# Patient Record
Sex: Female | Born: 1975
Health system: Southern US, Community
[De-identification: ages and names within clinical notes are randomized; demographics above are authoritative.]

## PROBLEM LIST (undated history)

## (undated) DIAGNOSIS — F32A Depression, unspecified: Secondary | ICD-10-CM

## (undated) DIAGNOSIS — Z5189 Encounter for other specified aftercare: Secondary | ICD-10-CM

## (undated) DIAGNOSIS — D649 Anemia, unspecified: Secondary | ICD-10-CM

## (undated) DIAGNOSIS — C801 Malignant (primary) neoplasm, unspecified: Secondary | ICD-10-CM

## (undated) DIAGNOSIS — Z8719 Personal history of other diseases of the digestive system: Secondary | ICD-10-CM

## (undated) DIAGNOSIS — R112 Nausea with vomiting, unspecified: Secondary | ICD-10-CM

## (undated) DIAGNOSIS — Z9889 Other specified postprocedural states: Secondary | ICD-10-CM

## (undated) DIAGNOSIS — F419 Anxiety disorder, unspecified: Secondary | ICD-10-CM

## (undated) DIAGNOSIS — K219 Gastro-esophageal reflux disease without esophagitis: Secondary | ICD-10-CM

## (undated) HISTORY — DX: Encounter for other specified aftercare: Z51.89

## (undated) HISTORY — PX: CHOLECYSTECTOMY: SHX55

## (undated) HISTORY — DX: Anxiety disorder, unspecified: F41.9

## (undated) HISTORY — DX: Gastro-esophageal reflux disease without esophagitis: K21.9

## (undated) HISTORY — DX: Malignant (primary) neoplasm, unspecified: C80.1

## (undated) HISTORY — DX: Depression, unspecified: F32.A

---

## 1997-06-15 ENCOUNTER — Other Ambulatory Visit: Admission: RE | Admit: 1997-06-15 | Discharge: 1997-06-15 | Payer: Self-pay | Admitting: Obstetrics

## 1997-10-15 ENCOUNTER — Inpatient Hospital Stay (HOSPITAL_COMMUNITY): Admission: AD | Admit: 1997-10-15 | Discharge: 1997-10-15 | Payer: Self-pay | Admitting: Obstetrics

## 1998-04-08 ENCOUNTER — Inpatient Hospital Stay (HOSPITAL_COMMUNITY): Admission: AD | Admit: 1998-04-08 | Discharge: 1998-04-08 | Payer: Self-pay | Admitting: Obstetrics

## 1998-04-23 ENCOUNTER — Encounter: Admission: RE | Admit: 1998-04-23 | Discharge: 1998-04-23 | Payer: Self-pay | Admitting: Sports Medicine

## 1998-05-04 ENCOUNTER — Encounter (INDEPENDENT_AMBULATORY_CARE_PROVIDER_SITE_OTHER): Payer: Self-pay | Admitting: *Deleted

## 1998-05-04 LAB — CONVERTED CEMR LAB

## 1998-05-16 ENCOUNTER — Encounter: Admission: RE | Admit: 1998-05-16 | Discharge: 1998-05-16 | Payer: Self-pay | Admitting: Family Medicine

## 1998-05-16 ENCOUNTER — Other Ambulatory Visit: Admission: RE | Admit: 1998-05-16 | Discharge: 1998-05-16 | Payer: Self-pay | Admitting: *Deleted

## 1998-05-21 ENCOUNTER — Inpatient Hospital Stay (HOSPITAL_COMMUNITY): Admission: AD | Admit: 1998-05-21 | Discharge: 1998-05-21 | Payer: Self-pay | Admitting: *Deleted

## 1998-05-22 ENCOUNTER — Ambulatory Visit (HOSPITAL_COMMUNITY): Admission: RE | Admit: 1998-05-22 | Discharge: 1998-05-22 | Payer: Self-pay | Admitting: *Deleted

## 1998-05-22 ENCOUNTER — Encounter: Payer: Self-pay | Admitting: *Deleted

## 1998-06-18 ENCOUNTER — Ambulatory Visit (HOSPITAL_COMMUNITY): Admission: RE | Admit: 1998-06-18 | Discharge: 1998-06-18 | Payer: Self-pay

## 1998-06-19 ENCOUNTER — Encounter: Admission: RE | Admit: 1998-06-19 | Discharge: 1998-06-19 | Payer: Self-pay | Admitting: Family Medicine

## 1998-07-02 ENCOUNTER — Other Ambulatory Visit: Admission: RE | Admit: 1998-07-02 | Discharge: 1998-07-02 | Payer: Self-pay | Admitting: Family Medicine

## 1998-07-02 ENCOUNTER — Encounter: Admission: RE | Admit: 1998-07-02 | Discharge: 1998-07-02 | Payer: Self-pay | Admitting: Family Medicine

## 1998-07-12 ENCOUNTER — Inpatient Hospital Stay (HOSPITAL_COMMUNITY): Admission: AD | Admit: 1998-07-12 | Discharge: 1998-07-12 | Payer: Self-pay | Admitting: Obstetrics & Gynecology

## 1998-07-29 ENCOUNTER — Encounter: Admission: RE | Admit: 1998-07-29 | Discharge: 1998-07-29 | Payer: Self-pay | Admitting: Family Medicine

## 1998-07-31 ENCOUNTER — Inpatient Hospital Stay (HOSPITAL_COMMUNITY): Admission: AD | Admit: 1998-07-31 | Discharge: 1998-07-31 | Payer: Self-pay | Admitting: Obstetrics

## 1998-08-02 ENCOUNTER — Encounter: Admission: RE | Admit: 1998-08-02 | Discharge: 1998-08-02 | Payer: Self-pay | Admitting: Family Medicine

## 1998-08-09 ENCOUNTER — Encounter: Admission: RE | Admit: 1998-08-09 | Discharge: 1998-08-09 | Payer: Self-pay | Admitting: Family Medicine

## 1998-08-26 ENCOUNTER — Encounter: Admission: RE | Admit: 1998-08-26 | Discharge: 1998-08-26 | Payer: Self-pay | Admitting: Family Medicine

## 1998-09-07 ENCOUNTER — Inpatient Hospital Stay (HOSPITAL_COMMUNITY): Admission: AD | Admit: 1998-09-07 | Discharge: 1998-09-07 | Payer: Self-pay | Admitting: *Deleted

## 1998-09-10 ENCOUNTER — Encounter: Admission: RE | Admit: 1998-09-10 | Discharge: 1998-09-10 | Payer: Self-pay | Admitting: Family Medicine

## 1998-09-24 ENCOUNTER — Encounter: Admission: RE | Admit: 1998-09-24 | Discharge: 1998-09-24 | Payer: Self-pay | Admitting: Sports Medicine

## 1998-09-27 ENCOUNTER — Inpatient Hospital Stay (HOSPITAL_COMMUNITY): Admission: AD | Admit: 1998-09-27 | Discharge: 1998-10-02 | Payer: Self-pay | Admitting: Obstetrics & Gynecology

## 1998-10-02 ENCOUNTER — Encounter: Admission: RE | Admit: 1998-10-02 | Discharge: 1998-10-02 | Payer: Self-pay | Admitting: Family Medicine

## 1998-11-18 ENCOUNTER — Encounter: Admission: RE | Admit: 1998-11-18 | Discharge: 1998-11-18 | Payer: Self-pay | Admitting: Family Medicine

## 1998-12-03 ENCOUNTER — Encounter: Admission: RE | Admit: 1998-12-03 | Discharge: 1998-12-03 | Payer: Self-pay | Admitting: Family Medicine

## 1999-01-02 ENCOUNTER — Encounter: Payer: Self-pay | Admitting: Emergency Medicine

## 1999-01-02 ENCOUNTER — Encounter (INDEPENDENT_AMBULATORY_CARE_PROVIDER_SITE_OTHER): Payer: Self-pay | Admitting: Specialist

## 1999-01-02 ENCOUNTER — Inpatient Hospital Stay (HOSPITAL_COMMUNITY): Admission: EM | Admit: 1999-01-02 | Discharge: 1999-01-03 | Payer: Self-pay | Admitting: Emergency Medicine

## 1999-01-02 ENCOUNTER — Encounter: Payer: Self-pay | Admitting: General Surgery

## 1999-01-30 ENCOUNTER — Encounter: Admission: RE | Admit: 1999-01-30 | Discharge: 1999-01-30 | Payer: Self-pay | Admitting: Family Medicine

## 1999-02-21 ENCOUNTER — Encounter: Admission: RE | Admit: 1999-02-21 | Discharge: 1999-02-21 | Payer: Self-pay | Admitting: Family Medicine

## 1999-03-17 ENCOUNTER — Other Ambulatory Visit: Admission: RE | Admit: 1999-03-17 | Discharge: 1999-03-17 | Payer: Self-pay | Admitting: *Deleted

## 1999-03-17 ENCOUNTER — Encounter: Admission: RE | Admit: 1999-03-17 | Discharge: 1999-03-17 | Payer: Self-pay | Admitting: Family Medicine

## 1999-04-29 ENCOUNTER — Encounter: Admission: RE | Admit: 1999-04-29 | Discharge: 1999-04-29 | Payer: Self-pay | Admitting: Obstetrics & Gynecology

## 1999-07-31 ENCOUNTER — Encounter: Admission: RE | Admit: 1999-07-31 | Discharge: 1999-07-31 | Payer: Self-pay | Admitting: Family Medicine

## 1999-08-22 ENCOUNTER — Encounter: Admission: RE | Admit: 1999-08-22 | Discharge: 1999-08-22 | Payer: Self-pay | Admitting: Obstetrics & Gynecology

## 1999-08-25 ENCOUNTER — Encounter: Admission: RE | Admit: 1999-08-25 | Discharge: 1999-08-25 | Payer: Self-pay | Admitting: Family Medicine

## 1999-08-27 ENCOUNTER — Encounter: Admission: RE | Admit: 1999-08-27 | Discharge: 1999-08-27 | Payer: Self-pay | Admitting: Family Medicine

## 2003-10-03 ENCOUNTER — Emergency Department (HOSPITAL_COMMUNITY): Admission: EM | Admit: 2003-10-03 | Discharge: 2003-10-03 | Payer: Self-pay | Admitting: Emergency Medicine

## 2005-09-02 ENCOUNTER — Other Ambulatory Visit: Admission: RE | Admit: 2005-09-02 | Discharge: 2005-09-02 | Payer: Self-pay | Admitting: Gynecology

## 2006-07-02 ENCOUNTER — Encounter (INDEPENDENT_AMBULATORY_CARE_PROVIDER_SITE_OTHER): Payer: Self-pay | Admitting: *Deleted

## 2006-12-14 ENCOUNTER — Ambulatory Visit (HOSPITAL_COMMUNITY): Admission: RE | Admit: 2006-12-14 | Discharge: 2006-12-14 | Payer: Self-pay | Admitting: Obstetrics and Gynecology

## 2006-12-23 ENCOUNTER — Inpatient Hospital Stay (HOSPITAL_COMMUNITY): Admission: RE | Admit: 2006-12-23 | Discharge: 2006-12-27 | Payer: Self-pay | Admitting: Obstetrics and Gynecology

## 2008-06-10 IMAGING — US US AMNIOCENTESIS
1 series · 1 of 1 positions shown · non-contrast
Comparison: none

ULTRASOUND-GUIDED AMNIOCENTESIS:
 Ultrasound was utilized to perform amniocentesis by the requesting physician.

[Series 1: us amniocentesis · 1 of 1 slices shown]
[im 1/1]
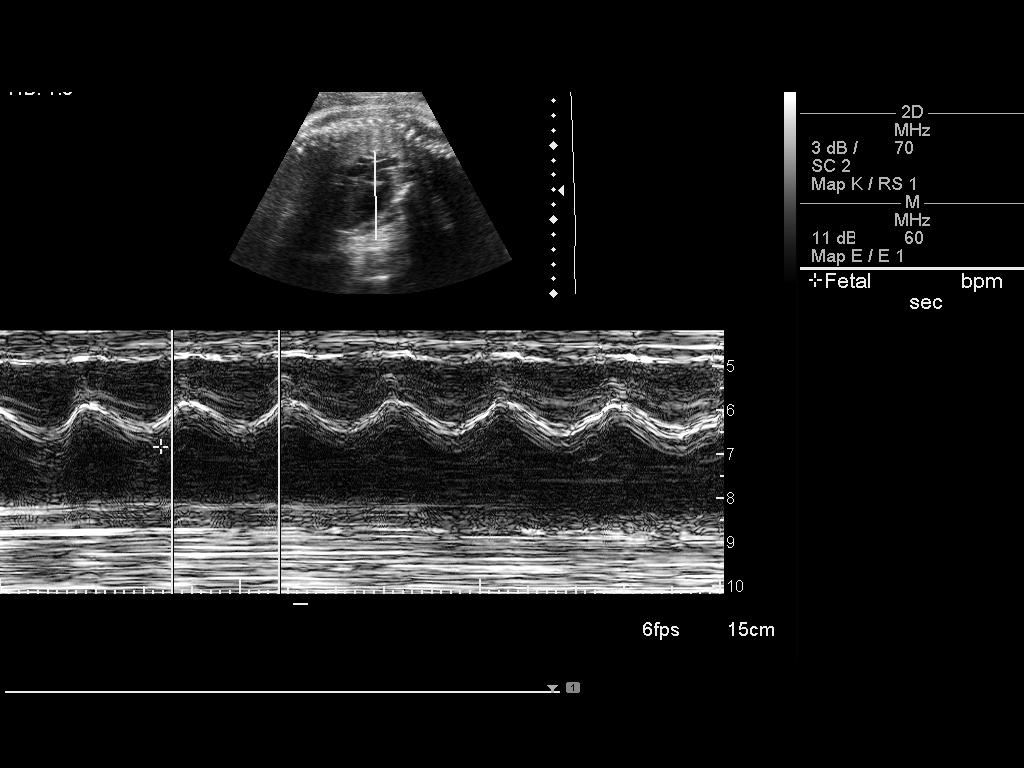

[1 of 1 positions shown; findings below may reference images not displayed]

IMPRESSION: No radiographic interpretation.

## 2010-05-25 ENCOUNTER — Encounter: Payer: Self-pay | Admitting: Obstetrics and Gynecology

## 2010-09-16 NOTE — Op Note (Signed)
NAME:  Dawn Russo, Dawn Russo                ACCOUNT NO.:  [id number]   MEDICAL RECORD NO.:  [id number]          PATIENT TYPE:  INP   LOCATION:  9141                          FACILITY:  [facility]   PHYSICIAN:  Kendra H. Tenny Craw, MD     DATE OF BIRTH:  03/10/1976   DATE OF PROCEDURE:  12/23/2006  DATE OF DISCHARGE:                               OPERATIVE REPORT   ATTENDING PHYSICIAN:  Kendra H. Tenny Craw, M.D.   PREOPERATIVE DIAGNOSIS:  1. 37 and 4-week intrauterine pregnancy.  2. A1 gestational diabetes mellitus.  3. History of prior cesarean section x2.  4. 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   POSTOPERATIVE DIAGNOSIS:  1. 37 and 4-week intrauterine pregnancy.  2. A1 gestational diabetes mellitus.  3. History of prior cesarean section x2.  4. 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  5. Small lower uterine segment window.  6. Left adnexal endometriotic implant.   PROCEDURE:  Repeat low transverse cesarean section and fulguration of  left ovarian endometriosis.   SURGEON:  Freddrick March. Tenny Craw, M.D.   ASSISTANT:  Luvenia Redden, M.D.   ANESTHESIA:  Spinal.   FINDINGS:  Vigorous female infant in the vertex presentation weighing 7  pounds 3 ounces with Apgar scores of 7 at 1 minute and 7 at 5 minutes.  Small endometriotic implant was noted to the left ovary. Additionally it  was noted that in the lower uterine segment and very small 1 cm window  was visualized consistent with small incisional dehiscence. Additionally  the lower uterine segment was extremely thin and weak and when it was  being repaired the suture would tear through the lower uterine segment.   SPECIMENS:  Placenta for disposal.   ESTIMATED BLOOD LOSS:  700 mL.   COMPLICATIONS:  None.   PROCEDURE:  The patient is a 35 year old gravida 4, para 2-0-1-2 who has  been followed in this pregnancy for gestational diabetes and a history  of a uterine incisional dehiscence at her last delivery.   Given her  gestational diabetes and the history of uterine dehiscence there was  concern to try to deliver prior to the onset of labor.  Therefore an  amniocentesis for fetal lung maturity was performed on August 12,  however this returned that the fetal lungs were not mature and therefore  the decision was made to wait for approximately a week and half and then  have an outright delivery. The discussion was had with Ms. Andrey Campanile that  because of the gestational diabetes and the immature fetal lungs on  amniocentesis 10 days prior that we were balancing of the risks of fetal  lung immaturity versus uterine rupture. It was felt that it was most  prudent to proceed with a repeat low transverse cesarean section prior  to the onset of labor.  Following the appropriate informed consent the  patient was brought to the operating room where spinal anesthesia was  administered and found to be adequate.  The patient was prepped and  draped in the normal sterile fashion.  A scalpel was used to make a  Pfannenstiel's skin incision using the patient's prior scar which was  carried down through the underlying layers of soft tissue to the fascia.  The fascia was incised in the midline and fascial incision was extended  laterally with Mayo scissors.  Superior aspect of the fascial incision  was grasped with Kocher clamps x2, tented up and the underlying rectus  muscles were dissected off sharply with the electrocautery unit.  The  same procedure was repeated on the inferior aspect of the fascial  incision. The rectus muscles were separated in the midline and the  abdominal peritoneum was identified and entered sharply and the incision  was extended superiorly and inferiorly with good visualization of the  bladder.  The bladder blade was then inserted. The vesicouterine  peritoneum was identified, tented up and entered sharply with the  Metzenbaum scissors and the incision was extended laterally with the   Metzenbaum and the bladder flap was created digitally.  It was upon  inspection of the lower uterine section after the creation of the  bladder flap that a very small 1 x 1 cm window in the lower uterine  segment was noted which was consistent with a small uterine dehiscence.  The remainder of the lower uterine segment was paper thin. Scalpel was  then used to make a low transverse incision on the uterus which was  extended laterally with blunt dissection.  The fetal vertex was  identified, brought up through the uterine incision.  The infant was  bulb suctioned on the operative field and then the body was delivered.  The cord was clamped and cut and the infant was passed to the waiting  pediatricians.  The infant was noted to cry vigorously on the operative  field. The placenta was spontaneously delivered.  The uterus was  exteriorized, cleared of all clot and debris.  The uterine incision was  repaired with #1 chromic in a running locked fashion.  During repair of  the uterine incision each time that the suture was placed in the  inferior aspect of the lower uterine segment because the tissue was so  thin and tenuous the suture with rip through leaving holes in the lower  uterine segment.  This was repaired with a second imbricating layer and  several further figure-of-eight sutures were used to reinforce the lower  uterine segment.  The incision was noted to be hemostatic after  completion of repair of the incision.  The ovaries were inspected and a  small implant of endometriosis was noted on the left ovary.  This was  fulgurated using the electrocautery unit.  No evidence of endometriosis  was noted on the right ovary and the tubes were both normal bilaterally.  The patient tolerated the procedure well and was brought to the recovery  room following the procedure. After the delivery of the baby and the  care of the pediatrician it was noted that the baby was grunting  somewhat and it  was felt that the baby needed to be transferred to the  neonatal ICU for further monitoring and potentially supplemental oxygen.  Final Apgar score seven and seven.      Freddrick March. Tenny Craw, MD  Electronically Signed     KHR/MEDQ  D:  12/23/2006  T:  12/23/2006  Job:  [id number]

## 2010-09-19 NOTE — Discharge Summary (Signed)
NAME:  PAULYNE, MOOTY                ACCOUNT NO.:  [id number]   MEDICAL RECORD NO.:  [id number]          PATIENT TYPE:  INP   LOCATION:  9141                          FACILITY:  [facility]   PHYSICIAN:  Randye Lobo, M.D.   DATE OF BIRTH:  1976-03-25   DATE OF ADMISSION:  12/23/2006  DATE OF DISCHARGE:  12/27/2006                               DISCHARGE SUMMARY   FINAL DIAGNOSES:  1. Intrauterine gestation at 46 and four-sevenths weeks gestation.  2. A1 gestational diabetes mellitus.  3. History of prior cesarean sections x2.  4. History of uterine incisional dehiscence with the last pregnancy.  5. Left adnexal endometriotic implant.  6. Nonimmune to rubella.  7. History of prior transfusion secondary to blood loss from prior      cesarean section.   PROCEDURE:  Repeat low transverse cesarean section with fulguration of  left ovarian endometriosis.  Surgeon:  Dr. Waynard Reeds, assistant:  Dr.  Lodema Hong.  Complications:  None.   This 35 year old G4, P2-0-1-2 presents at 80 and four-sevenths weeks  gestation for delivery.  The patient's antepartum course had been  complicated by gestational diabetes mellitus which has been well  controlled.  The patient also had a history of uterine incisional  dehiscence after her last pregnancy.  At this point, amniocentesis was  performed for lung maturity.  This was performed on August 12 so  therefore, another week was waited.  The patient did have a history of a  blood transfusion secondary to blood loss with her last cesarean  section.  This delivery wanted to be performed before the patient  actually went into labor.  The patient was taken to the operating room  on December 23, 2006, by Dr. Waynard Reeds where a repeat low transverse  cesarean section was performed with the delivery of a 7-pound 3-ounce  female infant with Apgars of 7 and 7.  There were some small endometriotic  implants noted on her left ovary which were fulgurated, and there was  also a very small 1-cm window noted in the lower uterine segment that  was consistent with her prior diagnosis.  The lower uterine segment was  also extremely thin and weak.  The patient's delivery went without  complications.  The patient's postoperative course was benign without  any significant fevers.  They did want their little boy circumcised  before discharge.  The patient was felt ready for discharge on  postoperative day #3.  She was diagnosed with possible impetigo of the  face.  She was sent home with some Keflex 250 mg q.i.d. for 7 days.  The  patient was also told to continue her prenatal vitamins and an iron  supplement two to three times daily, was given Percocet one to two every  4-6 hours as needed for pain, ibuprofen up to 600 mg every 6 hours as  needed for pain.   LABORATORIES ON DISCHARGE:  The patient had a hemoglobin of 9.0; white  blood cell count of 9.8; and platelets of 176,000.      Leilani Able, P.A.-C.  Randye Lobo, M.D.  Electronically Signed    MB/MEDQ  D:  01/26/2007  T:  01/27/2007  Job:  [id number]

## 2011-02-13 LAB — CBC
HCT: 26.1 — ABNORMAL LOW
HCT: 31.3 — ABNORMAL LOW
Hemoglobin: 10.9 — ABNORMAL LOW
Hemoglobin: 9 — ABNORMAL LOW
MCHC: 34.4
MCHC: 35
MCV: 86.4
RBC: 3.02 — ABNORMAL LOW
RDW: 12.5

## 2011-02-13 LAB — CROSSMATCH

## 2011-02-16 LAB — CBC
Hemoglobin: 11.6 — ABNORMAL LOW
MCHC: 33.3
RDW: 12.4

## 2011-02-16 LAB — BASIC METABOLIC PANEL
CO2: 22
Calcium: 9.3
Creatinine, Ser: 0.68
Glucose, Bld: 71
Sodium: 134 — ABNORMAL LOW

## 2011-02-16 LAB — FLM(FETAL LUNG MATURITY), AMNIOTIC FLUID: Fetal/Lung Maturity: 12.5

## 2014-01-25 ENCOUNTER — Encounter (HOSPITAL_COMMUNITY): Payer: Self-pay | Admitting: Emergency Medicine

## 2014-01-25 ENCOUNTER — Emergency Department (HOSPITAL_COMMUNITY)
Admission: EM | Admit: 2014-01-25 | Discharge: 2014-01-25 | Disposition: A | Discharge status: Home / Self Care | Payer: Managed Care, Other (non HMO) | Source: Home / Self Care | Attending: Family Medicine | Admitting: Family Medicine

## 2014-01-25 DIAGNOSIS — J029 Acute pharyngitis, unspecified: Secondary | ICD-10-CM

## 2014-01-25 LAB — POCT RAPID STREP A: STREPTOCOCCUS, GROUP A SCREEN (DIRECT): NEGATIVE

## 2014-01-25 MED ORDER — PREDNISONE 10 MG PO TABS
30.0000 mg | ORAL_TABLET | Freq: Every day | ORAL | Status: DC
Start: 2014-01-25 — End: 2017-11-29

## 2014-01-25 NOTE — ED Notes (Signed)
Sore throat that started last night 

## 2014-01-25 NOTE — ED Provider Notes (Signed)
Dawn Russo is a 38 y.o. female who presents to Urgent Care today for sore throat. Patient has a two-day history of sore throat with no cough or congestion. Patient does note mild fevers and chills and nausea but denies any vomiting or diarrhea. No chest pains or palpitations. Patient has tried some Chloraseptic and cough drops which helped a bit.   History reviewed. No pertinent past medical history. History  Substance Use Topics  . Smoking status: Never Smoker   . Smokeless tobacco: Not on file  . Alcohol Use: No   ROS as above Medications: No current facility-administered medications for this encounter.   Current Outpatient Prescriptions  Medication Sig Dispense Refill  . predniSONE (DELTASONE) 10 MG tablet Take 3 tablets (30 mg total) by mouth daily.  15 tablet  0    Exam:  BP 137/89  Pulse 78  Temp(Src) 97.4 F (36.3 C) (Oral)  Resp 16  SpO2 100% Gen: Well NAD HEENT: EOMI,  MMM posterior pharynx mildly erythematous without exudate. Normal tympanic membranes bilaterally. Mild bilateral anterior cervical lymphadenopathy is present. Lungs: Normal work of breathing. CTABL Heart: RRR no MRG Abd: NABS, Soft. Nondistended, Nontender Exts: Brisk capillary refill, warm and well perfused.   Results for orders placed during the hospital encounter of 01/25/14 (from the past 24 hour(s))  POCT RAPID STREP A ([facility] ONLY)     Status: None   Collection Time    01/25/14  1:07 PM      Result Value Ref Range   Streptococcus, Group A Screen (Direct) NEGATIVE  NEGATIVE   No results found.  Assessment and Plan: 38 y.o. female with viral pharyngitis. Culture pending. Treat with prednisone  Discussed warning signs or symptoms. Please see discharge instructions. Patient expresses understanding.     Rodolph Bong, MD 01/25/14 1324

## 2014-01-25 NOTE — Discharge Instructions (Signed)
Thank you for coming in today. Take prednisone daily for 5 days. Use Tylenol as needed. Call or go to the emergency room if you get worse, have trouble breathing, have chest pains, or palpitations.   Pharyngitis Pharyngitis is redness, pain, and swelling (inflammation) of your pharynx.  CAUSES  Pharyngitis is usually caused by infection. Most of the time, these infections are from viruses (viral) and are part of a cold. However, sometimes pharyngitis is caused by bacteria (bacterial). Pharyngitis can also be caused by allergies. Viral pharyngitis may be spread from person to person by coughing, sneezing, and personal items or utensils (cups, forks, spoons, toothbrushes). Bacterial pharyngitis may be spread from person to person by more intimate contact, such as kissing.  SIGNS AND SYMPTOMS  Symptoms of pharyngitis include:   Sore throat.   Tiredness (fatigue).   Low-grade fever.   Headache.  Joint pain and muscle aches.  Skin rashes.  Swollen lymph nodes.  Plaque-like film on throat or tonsils (often seen with bacterial pharyngitis). DIAGNOSIS  Your health care provider will ask you questions about your illness and your symptoms. Your medical history, along with a physical exam, is often all that is needed to diagnose pharyngitis. Sometimes, a rapid strep test is done. Other lab tests may also be done, depending on the suspected cause.  TREATMENT  Viral pharyngitis will usually get better in 3-4 days without the use of medicine. Bacterial pharyngitis is treated with medicines that kill germs (antibiotics).  HOME CARE INSTRUCTIONS   Drink enough water and fluids to keep your urine clear or pale yellow.   Only take over-the-counter or prescription medicines as directed by your health care provider:   If you are prescribed antibiotics, make sure you finish them even if you start to feel better.   Do not take aspirin.   Get lots of rest.   Gargle with 8 oz of salt  water ( tsp of salt per 1 qt of water) as often as every 1-2 hours to soothe your throat.   Throat lozenges (if you are not at risk for choking) or sprays may be used to soothe your throat. SEEK MEDICAL CARE IF:   You have large, tender lumps in your neck.  You have a rash.  You cough up green, yellow-brown, or bloody spit. SEEK IMMEDIATE MEDICAL CARE IF:   Your neck becomes stiff.  You drool or are unable to swallow liquids.  You vomit or are unable to keep medicines or liquids down.  You have severe pain that does not go away with the use of recommended medicines.  You have trouble breathing (not caused by a stuffy nose). MAKE SURE YOU:   Understand these instructions.  Will watch your condition.  Will get help right away if you are not doing well or get worse. Document Released: 04/20/2005 Document Revised: 02/08/2013 Document Reviewed: 12/26/2012 [facility] Patient Information 2015 [place], [place]. This information is not intended to replace advice given to you by your health care provider. Make sure you discuss any questions you have with your health care provider.

## 2014-01-27 LAB — CULTURE, GROUP A STREP

## 2015-03-11 ENCOUNTER — Emergency Department (HOSPITAL_COMMUNITY)
Admission: EM | Admit: 2015-03-11 | Discharge: 2015-03-11 | Disposition: A | Discharge status: Home / Self Care | Payer: Managed Care, Other (non HMO) | Source: Home / Self Care

## 2015-03-11 ENCOUNTER — Encounter (HOSPITAL_COMMUNITY): Payer: Self-pay | Admitting: Emergency Medicine

## 2015-03-11 DIAGNOSIS — J0100 Acute maxillary sinusitis, unspecified: Secondary | ICD-10-CM

## 2015-03-11 DIAGNOSIS — J069 Acute upper respiratory infection, unspecified: Secondary | ICD-10-CM

## 2015-03-11 LAB — POCT RAPID STREP A: Streptococcus, Group A Screen (Direct): NEGATIVE

## 2015-03-11 MED ORDER — AMOXICILLIN 500 MG PO CAPS: 500.0000 mg | ORAL_CAPSULE | Freq: Three times a day (TID) | ORAL | Status: DC

## 2015-03-11 NOTE — ED Provider Notes (Signed)
CSN: [id number]     Arrival date & time 03/11/15  1315 History   None    No chief complaint on file.  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 Past Surgical History  Procedure Laterality Date  . Cholecystectomy    . Cesarean section     No family history on file. Social History  Substance Use Topics  . Smoking status: Never Smoker   . Smokeless tobacco: Not on file  . Alcohol Use: No   OB History    No data available     Review of Systems ROS +'ve flu sxs, cough, sinus pressure  Denies: HEADACHE, NAUSEA, ABDOMINAL PAIN, CHEST PAIN, CONGESTION, DYSURIA, SHORTNESS OF BREATH  Allergies  Review of patient's allergies indicates no known allergies.  Home Medications   Prior to Admission medications   Medication Sig Start Date End Date Taking? Authorizing Provider  predniSONE (DELTASONE) 10 MG tablet Take 3 tablets (30 mg total) by mouth daily. 01/25/14   Rodolph BongEvan S Corey, MD   Meds Ordered and Administered this Visit  Medications - No data to display  BP 139/89 mmHg  Pulse 75  Temp(Src) 98.6 F (37 C) (Oral)  Resp 18  SpO2 100% No data found.   Physical Exam NURSES NOTES AND VITAL SIGNS REVIEWED. CONSTITUTIONAL: Well developed, well nourished, no acute distress HEENT: normocephalic, atraumatic, TM's clear, sinuses: normal transillumination. Turbinates injected but not boggy in appearance.  EYES: Conjunctiva normal NECK:normal ROM, supple PULMONARY:No respiratory distress, normal effort, Lungs: CTAb/l CARDIOVASCULAR: RRR, no murmur ABDOMEN: soft, ND, NT, +'ve  BS MUSCULOSKELETAL: Normal ROM of all extremities SKIN: warm and dry without rash PSYCHIATRIC: Mood and affect normal  ED Course  Procedures (including critical care time)  Labs Review Labs Reviewed - No data to display  Imaging Review No results found.   Visual Acuity Review  Right Eye Distance:   Left Eye Distance:   Bilateral Distance:    Right Eye Near:   Left Eye Near:    Bilateral Near:         MDM   1. URI (upper respiratory infection)   2. Acute maxillary sinusitis, recurrence not specified    rx for acute sinusitis provided.  Return to work note given Discharged home in stable condition    Tharon AquasFrank C Patrick, GeorgiaPA 03/11/15 2043

## 2015-03-11 NOTE — Discharge Instructions (Signed)
Upper Respiratory Infection, Adult Most upper respiratory infections (URIs) are caused by a virus. A URI affects the nose, throat, and upper air passages. The most common type of URI is often called "the common cold." HOME CARE   Take medicines only as told by your doctor.  Gargle warm saltwater or take cough drops to comfort your throat as told by your doctor.  Use a warm mist humidifier or inhale steam from a shower to increase air moisture. This may make it easier to breathe.  Drink enough fluid to keep your pee (urine) clear or pale yellow.  Eat soups and other clear broths.  Have a healthy diet.  Rest as needed.  Go back to work when your fever is gone or your doctor says it is okay.  You may need to stay home longer to avoid giving your URI to others.  You can also wear a face mask and wash your hands often to prevent spread of the virus.  Use your inhaler more if you have asthma.  Do not use any tobacco products, including cigarettes, chewing tobacco, or electronic cigarettes. If you need help quitting, ask your doctor. GET HELP IF:  You are getting worse, not better.  Your symptoms are not helped by medicine.  You have chills.  You are getting more short of breath.  You have brown or red mucus.  You have yellow or brown discharge from your nose.  You have pain in your face, especially when you bend forward.  You have a fever.  You have puffy (swollen) neck glands.  You have pain while swallowing.  You have white areas in the back of your throat. GET HELP RIGHT AWAY IF:   You have very bad or constant:  Headache.  Ear pain.  Pain in your forehead, behind your eyes, and over your cheekbones (sinus pain).  Chest pain.  You have long-lasting (chronic) lung disease and any of the following:  Wheezing.  Long-lasting cough.  Coughing up blood.  A change in your usual mucus.  You have a stiff neck.  You have changes in  your:  Vision.  Hearing.  Thinking.  Mood. MAKE SURE YOU:   Understand these instructions.  Will watch your condition.  Will get help right away if you are not doing well or get worse.   This information is not intended to replace advice given to you by your health care provider. Make sure you discuss any questions you have with your health care provider.   Document Released: 10/07/2007 Document Revised: 09/04/2014 Document Reviewed: 07/26/2013 Elsevier Interactive Patient Education 2016 [employer] Pharyngitis Pharyngitis is a sore throat (pharynx). There is redness, pain, and swelling of your throat. HOME CARE   Drink enough fluids to keep your pee (urine) clear or pale yellow.  Only take medicine as told by your doctor.  You may get sick again if you do not take medicine as told. Finish your medicines, even if you start to feel better.  Do not take aspirin.  Rest.  Rinse your mouth (gargle) with salt water ( tsp of salt per 1 qt of water) every 1-2 hours. This will help the pain.  If you are not at risk for choking, you can suck on hard candy or sore throat lozenges. GET HELP IF:  You have large, tender lumps on your neck.  You have a rash.  You cough up green, yellow-brown, or bloody spit. GET HELP RIGHT AWAY IF:   You have a  stiff neck.  You drool or cannot swallow liquids.  You throw up (vomit) or are not able to keep medicine or liquids down.  You have very bad pain that does not go away with medicine.  You have problems breathing (not from a stuffy nose). MAKE SURE YOU:   Understand these instructions.  Will watch your condition.  Will get help right away if you are not doing well or get worse.   This information is not intended to replace advice given to you by your health care provider. Make sure you discuss any questions you have with your health care provider.   Document Released: 10/07/2007 Document Revised: 02/08/2013 Document  Reviewed: 12/26/2012 Elsevier Interactive Patient Education 2016 Elsevier Inc. Sinusitis, Adult Sinusitis is redness, soreness, and puffiness (inflammation) of the air pockets in the bones of your face (sinuses). The redness, soreness, and puffiness can cause air and mucus to get trapped in your sinuses. This can allow germs to grow and cause an infection.  HOME CARE   Drink enough fluids to keep your pee (urine) clear or pale yellow.  Use a humidifier in your home.  Run a hot shower to create steam in the bathroom. Sit in the bathroom with the door closed. Breathe in the steam 3-4 times a day.  Put a warm, moist washcloth on your face 3-4 times a day, or as told by your doctor.  Use salt water sprays (saline sprays) to wet the thick fluid in your nose. This can help the sinuses drain.  Only take medicine as told by your doctor. GET HELP RIGHT AWAY IF:   Your pain gets worse.  You have very bad headaches.  You are sick to your stomach (nauseous).  You throw up (vomit).  You are very sleepy (drowsy) all the time.  Your face is puffy (swollen).  Your vision changes.  You have a stiff neck.  You have trouble breathing. MAKE SURE YOU:   Understand these instructions.  Will watch your condition.  Will get help right away if you are not doing well or get worse.   This information is not intended to replace advice given to you by your health care provider. Make sure you discuss any questions you have with your health care provider.   Document Released: 10/07/2007 Document Revised: 05/11/2014 Document Reviewed: 11/24/2011 Elsevier Interactive Patient Education [employer]2016 [employer]

## 2015-03-13 LAB — CULTURE, GROUP A STREP: Strep A Culture: NEGATIVE

## 2017-11-25 NOTE — Progress Notes (Signed)
Subjective:    Patient ID: Dawn Russo, female    DOB: 1975/08/06, 42 y.o.   MRN: [id number]  HPI:  Ms. Isaac is here to establish as a new pt.  She is a pleasant 42 year old female.  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 HAs will last for several hours with visual disturbance, "bright spots" She also reports sensitivity to light/sonds, and nausea without vomiting during HA She treats with OTC Ibuprofen 800mg  1-2 times daily She denies being formally dx'd with migraines She also reports crying several times week, can last 35mins-several hours She estimates that this started >2 years ago She denies hx of depression or PTSD She has never seen mental health provider She has never been on antidepressants  She has been married to current husband > 13 years, and they share one son- 35 year old She also has 73 and 32 year old children from previous relationship She has three step-children ages 67, 69, 84 The 43 year old step son lost his daughter 3 years ago She reports excellent relationship with her children, however her husband continues to avoid participating in the family and spends "most of his time in the bedroom when he is not working" She reports this behavior steadily worsening > 8 years She denies hx of infidelity in the past, but is concerned that he has been unfaithful, stating "he has been working longer hours at work" Her husband works at [employer] center and has a sig amount of work stress She reports low libido She denies tobacco/ETOH use She runs 2-3 miles and weight trains 5-6 days/week She follows a heart healthy diet, drinks >gallon water/day and reports excellent sleep She works as a [employer] loss adjuster   Depression screen PHQ 2/9 11/29/2017  Decreased Interest 1  Down, Depressed, Hopeless 3  PHQ - 2 Score 4  Altered sleeping 0  Tired, decreased energy 0  Change in appetite 0  Feeling bad or failure about yourself  2  Trouble concentrating  0  Moving slowly or fidgety/restless 0  Suicidal thoughts 0  PHQ-9 Score 6  Difficult doing work/chores Somewhat difficult    Patient Care Team    Relationship Specialty Notifications Start End  Jett Kulzer, Jinny Blossom, NP PCP - General Family Medicine  10/25/17     Patient Active Problem List   Diagnosis Date Noted  . Healthcare maintenance 11/29/2017  . Crying with unclear etiology 11/29/2017  . Screening examination for STD (sexually transmitted disease) 11/29/2017     No past medical history on file.   Past Surgical History:  Procedure Laterality Date  . CESAREAN SECTION    . CHOLECYSTECTOMY       Family History  Problem Relation Age of Onset  . Stroke Mother      Social History   Substance and Sexual Activity  Drug Use No     Social History   Substance and Sexual Activity  Alcohol Use No     Social History   Tobacco Use  Smoking Status Former Smoker  . Packs/day: 1.00  . Years: 19.00  . Pack years: 19.00  . Types: Cigarettes  Smokeless Tobacco Never Used     Outpatient Encounter Medications as of 11/29/2017  Medication Sig  . ibuprofen (ADVIL,MOTRIN) 600 MG tablet Take 600 mg by mouth every 6 (six) hours as needed.  . [DISCONTINUED] amoxicillin (AMOXIL) 500 MG capsule Take 1 capsule (500 mg total) by mouth 3 (three) times daily.  . [  DISCONTINUED] predniSONE (DELTASONE) 10 MG tablet Take 3 tablets (30 mg total) by mouth daily. (Patient not taking: Reported on 03/11/2015)   No facility-administered encounter medications on file as of 11/29/2017.     Allergies: Patient has no known allergies.  Body mass index is 23.52 kg/m.  Blood pressure 116/87, pulse 88, resp. rate 16, height 5\' 4"  (1.626 m), weight 137 lb (62.1 kg), SpO2 99 %.  Review of Systems  Constitutional: Positive for fatigue. Negative for activity change, appetite change, chills, diaphoresis, fever and unexpected weight change.  HENT: Negative for congestion.   Eyes: Negative for  visual disturbance.  Respiratory: Negative for cough, chest tightness, shortness of breath, wheezing and stridor.   Cardiovascular: Negative for chest pain, palpitations and leg swelling.  Gastrointestinal: Negative for abdominal distention, abdominal pain, blood in stool, constipation, diarrhea, nausea and vomiting.  Endocrine: Negative for cold intolerance, heat intolerance, polydipsia, polyphagia and polyuria.  Genitourinary: Negative for difficulty urinating and flank pain.  Musculoskeletal: Negative for arthralgias, back pain, gait problem, joint swelling, myalgias, neck pain and neck stiffness.  Skin: Negative for color change, pallor, rash and wound.  Neurological: Positive for headaches. Negative for dizziness and weakness.  Hematological: Does not bruise/bleed easily.  Psychiatric/Behavioral: Positive for dysphoric mood. Negative for behavioral problems, confusion, decreased concentration, hallucinations, self-injury, sleep disturbance and suicidal ideas. The patient is not nervous/anxious and is not hyperactive.        Objective:   Physical Exam  Constitutional: She is oriented to person, place, and time. She appears well-developed and well-nourished. No distress.  HENT:  Head: Normocephalic and atraumatic.  Right Ear: External ear normal.  Left Ear: External ear normal.  Nose: Nose normal.  Mouth/Throat: Oropharynx is clear and moist.  Cardiovascular: Normal rate, regular rhythm, normal heart sounds and intact distal pulses.  No murmur heard. Pulmonary/Chest: Effort normal and breath sounds normal. No stridor. No respiratory distress. She has no wheezes. She has no rales. She exhibits no tenderness.  Neurological: She is alert and oriented to person, place, and time.  Skin: Skin is warm and dry. Capillary refill takes less than 2 seconds. No rash noted. She is not diaphoretic. No erythema. No pallor.  Psychiatric: Her speech is normal and behavior is normal. Judgment and  thought content normal. Her affect is blunt. Cognition and memory are normal.      Assessment & Plan:   1. Need for Tdap vaccination   2. Screening examination for STD (sexually transmitted disease)   3. Annual physical exam   4. Screening for thyroid disorder   5. Screening cholesterol level   6. Screening for diabetes mellitus (DM)   7. Encounter for vitamin deficiency screening   8. Screening, anemia, deficiency, iron   9. Healthcare maintenance   10. Crying with unclear etiology     Healthcare maintenance GREAT JOB on your healthy eating, regular exercise, and superb water intake! Continue to abstain from tobacco/ETOH use. We will call you when lab results are available. Referral to OB/GYN placed. Referral to Mental Health placed. Recommend discussing how you feel withy your husband and marriage counseling. Recommend complete physical with fasting labs in the next few months.  Crying with unclear etiology Referral to mental health placed  Screening examination for STD (sexually transmitted disease) HIV screening  Gonorrhea/Chlamydia/Trich screening completed    FOLLOW-UP:  Return in about 2 months (around 01/30/2018) for CPE, Fasting Labs.

## 2017-11-29 ENCOUNTER — Encounter: Payer: Self-pay | Admitting: Adult Health

## 2017-11-29 ENCOUNTER — Telehealth: Payer: Self-pay | Admitting: Obstetrics and Gynecology

## 2017-11-29 ENCOUNTER — Ambulatory Visit: Payer: BC Managed Care – PPO | Admitting: Adult Health

## 2017-11-29 VITALS — BP 116/87 | HR 88 | Resp 16 | Ht 64.0 in | Wt 137.0 lb

## 2017-11-29 DIAGNOSIS — Z113 Encounter for screening for infections with a predominantly sexual mode of transmission: Secondary | ICD-10-CM | POA: Diagnosis not present

## 2017-11-29 DIAGNOSIS — Z23 Encounter for immunization: Secondary | ICD-10-CM | POA: Diagnosis not present

## 2017-11-29 DIAGNOSIS — Z130 Encounter for screening for diseases of the blood and blood-forming organs and certain disorders involving the immune mechanism: Secondary | ICD-10-CM

## 2017-11-29 DIAGNOSIS — Z131 Encounter for screening for diabetes mellitus: Secondary | ICD-10-CM

## 2017-11-29 DIAGNOSIS — Z7689 Persons encountering health services in other specified circumstances: Secondary | ICD-10-CM | POA: Insufficient documentation

## 2017-11-29 DIAGNOSIS — Z13220 Encounter for screening for lipoid disorders: Secondary | ICD-10-CM | POA: Diagnosis not present

## 2017-11-29 DIAGNOSIS — Z1321 Encounter for screening for nutritional disorder: Secondary | ICD-10-CM

## 2017-11-29 DIAGNOSIS — Z0000 Encounter for general adult medical examination without abnormal findings: Secondary | ICD-10-CM

## 2017-11-29 DIAGNOSIS — Z1329 Encounter for screening for other suspected endocrine disorder: Secondary | ICD-10-CM

## 2017-11-29 DIAGNOSIS — R4583 Excessive crying of child, adolescent or adult: Secondary | ICD-10-CM

## 2017-11-29 NOTE — Patient Instructions (Addendum)
Mediterranean Diet A Mediterranean diet refers to food and lifestyle choices that are based on the traditions of countries located on the [place] Sea. This way of eating has been shown to help prevent certain conditions and improve outcomes for people who have chronic diseases, like kidney disease and heart disease. What are tips for following this plan? Lifestyle  Cook and eat meals together with your family, when possible.  Drink enough fluid to keep your urine clear or pale yellow.  Be physically active every day. This includes: ? Aerobic exercise like running or swimming. ? Leisure activities like gardening, walking, or housework.  Get 7-8 hours of sleep each night.  If recommended by your health care provider, drink red wine in moderation. This means 1 glass a day for nonpregnant women and 2 glasses a day for men. A glass of wine equals 5 oz (150 mL). Reading food labels  Check the serving size of packaged foods. For foods such as rice and pasta, the serving size refers to the amount of cooked product, not dry.  Check the total fat in packaged foods. Avoid foods that have saturated fat or trans fats.  Check the ingredients list for added sugars, such as corn syrup. Shopping  At the grocery store, buy most of your food from the areas near the walls of the store. This includes: ? Fresh fruits and vegetables (produce). ? Grains, beans, nuts, and seeds. Some of these may be available in unpackaged forms or large amounts (in bulk). ? Fresh seafood. ? Poultry and eggs. ? Low-fat dairy products.  Buy whole ingredients instead of prepackaged foods.  Buy fresh fruits and vegetables in-season from local farmers markets.  Buy frozen fruits and vegetables in resealable bags.  If you do not have access to quality fresh seafood, buy precooked frozen shrimp or canned fish, such as tuna, salmon, or sardines.  Buy small amounts of raw or cooked vegetables, salads, or olives from the  deli or salad bar at your store.  Stock your pantry so you always have certain foods on hand, such as olive oil, canned tuna, canned tomatoes, rice, pasta, and beans. Cooking  Cook foods with extra-virgin olive oil instead of using butter or other vegetable oils.  Have meat as a side dish, and have vegetables or grains as your main dish. This means having meat in small portions or adding small amounts of meat to foods like pasta or stew.  Use beans or vegetables instead of meat in common dishes like chili or lasagna.  Experiment with different cooking methods. Try roasting or broiling vegetables instead of steaming or sauteing them.  Add frozen vegetables to soups, stews, pasta, or rice.  Add nuts or seeds for added healthy fat at each meal. You can add these to yogurt, salads, or vegetable dishes.  Marinate fish or vegetables using olive oil, lemon juice, garlic, and fresh herbs. Meal planning  Plan to eat 1 vegetarian meal one day each week. Try to work up to 2 vegetarian meals, if possible.  Eat seafood 2 or more times a week.  Have healthy snacks readily available, such as: ? Vegetable sticks with hummus. ? Greek yogurt. ? Fruit and nut trail mix.  Eat balanced meals throughout the week. This includes: ? Fruit: 2-3 servings a day ? Vegetables: 4-5 servings a day ? Low-fat dairy: 2 servings a day ? Fish, poultry, or lean meat: 1 serving a day ? Beans and legumes: 2 or more servings a week ? Nuts   and seeds: 1-2 servings a day ? Whole grains: 6-8 servings a day ? Extra-virgin olive oil: 3-4 servings a day  Limit red meat and sweets to only a few servings a month What are my food choices?  Mediterranean diet ? Recommended ? Grains: Whole-grain pasta. Brown rice. Bulgar wheat. Polenta. Couscous. Whole-wheat bread. Orpah Cobbatmeal. Quinoa. ? Vegetables: Artichokes. Beets. Broccoli. Cabbage. Carrots. Eggplant. Green beans. Chard. Kale. Spinach. Onions. Leeks. Peas. Squash.  Tomatoes. Peppers. Radishes. ? Fruits: Apples. Apricots. Avocado. Berries. Bananas. Cherries. Dates. Figs. Grapes. Lemons. Melon. Oranges. Peaches. Plums. Pomegranate. ? Meats and other protein foods: Beans. Almonds. Sunflower seeds. Pine nuts. Peanuts. Cod. Salmon. Scallops. Shrimp. Tuna. Tilapia. Clams. Oysters. Eggs. ? Dairy: Low-fat milk. Cheese. Greek yogurt. ? Beverages: Water. Red wine. Herbal tea. ? Fats and oils: Extra virgin olive oil. Avocado oil. Grape seed oil. ? Sweets and desserts: [place] yogurt with honey. Baked apples. Poached pears. Trail mix. ? Seasoning and other foods: Basil. Cilantro. Coriander. Cumin. Mint. Parsley. Sage. Rosemary. Tarragon. Garlic. Oregano. Thyme. Pepper. Balsalmic vinegar. Tahini. Hummus. Tomato sauce. Olives. Mushrooms. ? Limit these ? Grains: Prepackaged pasta or rice dishes. Prepackaged cereal with added sugar. ? Vegetables: Deep fried potatoes (french fries). ? Fruits: Fruit canned in syrup. ? Meats and other protein foods: Beef. Pork. Lamb. Poultry with skin. Hot dogs. Tomasa BlaseBacon. ? Dairy: Ice cream. Sour cream. Whole milk. ? Beverages: Juice. Sugar-sweetened soft drinks. Beer. Liquor and spirits. ? Fats and oils: Butter. Canola oil. Vegetable oil. Beef fat (tallow). Lard. ? Sweets and desserts: Cookies. Cakes. Pies. Candy. ? Seasoning and other foods: Mayonnaise. Premade sauces and marinades. ? The items listed may not be a complete list. Talk with your dietitian about what dietary choices are right for you. Summary  The Mediterranean diet includes both food and lifestyle choices.  Eat a variety of fresh fruits and vegetables, beans, nuts, seeds, and whole grains.  Limit the amount of red meat and sweets that you eat.  Talk with your health care provider about whether it is safe for you to drink red wine in moderation. This means 1 glass a day for nonpregnant women and 2 glasses a day for men. A glass of wine equals 5 oz (150 mL). This information  is not intended to replace advice given to you by your health care provider. Make sure you discuss any questions you have with your health care provider. Document Released: 12/12/2015 Document Revised: 01/14/2016 Document Reviewed: 12/12/2015 Elsevier Interactive Patient Education  2018 [employer]  GREAT JOB on your healthy eating, regular exercise, and superb water intake! Continue to abstain from tobacco/ETOH use. We will call you when lab results are available. Referral to OB/GYN placed. Referral to Mental Health placed. Recommend discussing how you feel withy your husband and marriage counseling. Recommend complete physical with fasting labs in the next few months. WELCOME TO THE PRACTICE!

## 2017-11-29 NOTE — Assessment & Plan Note (Signed)
HIV screening  Gonorrhea/Chlamydia/Trich screening completed

## 2017-11-29 NOTE — Telephone Encounter (Signed)
Called and left a message for patient to call back to schedule a new patient doctor referral appointment with our office to see any provider for health care maintenance °

## 2017-11-29 NOTE — Assessment & Plan Note (Signed)
Referral to mental health placed 

## 2017-11-29 NOTE — Assessment & Plan Note (Signed)
GREAT JOB on your healthy eating, regular exercise, and superb water intake! Continue to abstain from tobacco/ETOH use. We will call you when lab results are available. Referral to OB/GYN placed. Referral to Mental Health placed. Recommend discussing how you feel withy your husband and marriage counseling. Recommend complete physical with fasting labs in the next few months.

## 2017-11-30 LAB — HIV ANTIBODY (ROUTINE TESTING W REFLEX): HIV Screen 4th Generation wRfx: NONREACTIVE

## 2017-12-01 LAB — CHLAMYDIA/GONOCOCCUS/TRICHOMONAS, NAA
Chlamydia by NAA: NEGATIVE
Gonococcus by NAA: NEGATIVE
TRICH VAG BY NAA: NEGATIVE

## 2017-12-06 NOTE — Progress Notes (Signed)
42 y.o. [id number] MarriedCaucasianF here for annual exam.  She has a mirena IUD placed in 10/13. No cycles, has occasional spotting.  No dyspareunia.     No LMP recorded. (Menstrual status: IUD).           Sexually active: Yes.    The current method of family planning is IUD.    Exercising: Yes.    gym and home exercise Smoker:  no  Health Maintenance: Pap:  Unsure, suspect 2013.  History of abnormal Pap:  Yes years ago per patient, biopsies done WNL, no surgery on her cervix.  MMG:  None  Colonoscopy:  None BMD:   N/A TDaP:  11/29/2017 Gardasil: Unsure   reports that she has quit smoking. Her smoking use included cigarettes. She has a 19.00 pack-year smoking history. She has never used smokeless tobacco. She reports that she does not drink alcohol or use drugs. Kids are 23, 26 and 43 Works for [employer] [address] [employer].   Past Medical History:  Diagnosis Date  . Blood transfusion without reported diagnosis   with childbirth.   Past Surgical History:  Procedure Laterality Date  . CESAREAN SECTION    . CHOLECYSTECTOMY    C/s x 3  Current Outpatient Medications  Medication Sig Dispense Refill  . ibuprofen (ADVIL,MOTRIN) 600 MG tablet Take 600 mg by mouth every 6 (six) hours as needed.     No current facility-administered medications for this visit.     Family History  Problem Relation Age of Onset  . Stroke Mother     Review of Systems  Constitutional: Negative.   HENT: Negative.   Eyes: Negative.   Respiratory: Negative.   Cardiovascular: Negative.   Gastrointestinal: Negative.   Endocrine: Negative.   Genitourinary: Negative.   Musculoskeletal: Negative.   Skin: Negative.   Allergic/Immunologic: Negative.   Neurological: Negative.   Hematological: Negative.   Psychiatric/Behavioral: Negative.     Exam:   BP 128/82 (BP Location: Right Arm, Patient Position: Sitting)   Pulse 80   Ht 5\' 4"  (1.626 m)   Wt 138 lb 3.2 oz (62.7 kg)   BMI 23.72 kg/m   Weight change:  @WEIGHTCHANGE @ Height:   Height: 5\' 4"  (162.6 cm)  Ht Readings from Last 3 Encounters:  12/07/17 5\' 4"  (1.626 m)  11/29/17 5\' 4"  (1.626 m)    General appearance: alert, cooperative and appears stated age Head: Normocephalic, without obvious abnormality, atraumatic Neck: no adenopathy, supple, symmetrical, trachea midline and thyroid normal to inspection and palpation Lungs: clear to auscultation bilaterally Cardiovascular: regular rate and rhythm Breasts: normal appearance, no masses or tenderness Abdomen: soft, non-tender; non distended,  no masses,  no organomegaly Extremities: extremities normal, atraumatic, no cyanosis or edema Skin: Skin color, texture, turgor normal. No rashes or lesions Lymph nodes: Cervical, supraclavicular, and axillary nodes normal. No abnormal inguinal nodes palpated Neurologic: Grossly normal   Pelvic: External genitalia:  no lesions              Urethra:  normal appearing urethra with no masses, tenderness or lesions              Bartholins and Skenes: normal                 Vagina: normal appearing vagina with normal color and discharge, no lesions              Cervix: no lesions and IUD string not seen  Bimanual Exam:  Uterus:  normal size, contour, position, consistency, mobility, non-tender and mid to retroverted              Adnexa: no mass, fullness, tenderness               Rectovaginal: Confirms               Anus:  normal sphincter tone, no lesions  Chaperone was present for exam.  A:  Well Woman with normal exam  IUD, overdue for removal  P:   Pap with hpv  Gardasil information given  Labs with primary  Return for IUD removal and replacement under u/s guidance, will pre-treat with cytotec.   Mammogram after 42  Discussed breast self exam  Discussed calcium and vit D intake

## 2017-12-07 ENCOUNTER — Encounter: Payer: Self-pay | Admitting: Obstetrics and Gynecology

## 2017-12-07 ENCOUNTER — Telehealth: Payer: Self-pay | Admitting: *Deleted

## 2017-12-07 ENCOUNTER — Other Ambulatory Visit (HOSPITAL_COMMUNITY)
Admission: RE | Admit: 2017-12-07 | Discharge: 2017-12-07 | Disposition: A | Discharge status: Home / Self Care | Payer: BC Managed Care – PPO | Source: Ambulatory Visit | Attending: Obstetrics and Gynecology | Admitting: Obstetrics and Gynecology

## 2017-12-07 ENCOUNTER — Other Ambulatory Visit: Payer: Self-pay

## 2017-12-07 ENCOUNTER — Ambulatory Visit: Payer: BC Managed Care – PPO | Admitting: Obstetrics and Gynecology

## 2017-12-07 VITALS — BP 128/82 | HR 80 | Ht 64.0 in | Wt 138.2 lb

## 2017-12-07 DIAGNOSIS — Z30431 Encounter for routine checking of intrauterine contraceptive device: Secondary | ICD-10-CM | POA: Diagnosis not present

## 2017-12-07 DIAGNOSIS — Z30433 Encounter for removal and reinsertion of intrauterine contraceptive device: Secondary | ICD-10-CM

## 2017-12-07 DIAGNOSIS — Z124 Encounter for screening for malignant neoplasm of cervix: Secondary | ICD-10-CM | POA: Diagnosis present

## 2017-12-07 DIAGNOSIS — T8332XA Displacement of intrauterine contraceptive device, initial encounter: Secondary | ICD-10-CM

## 2017-12-07 DIAGNOSIS — Z01419 Encounter for gynecological examination (general) (routine) without abnormal findings: Secondary | ICD-10-CM

## 2017-12-07 MED ORDER — MISOPROSTOL 200 MCG PO TABS: ORAL_TABLET | ORAL | 0 refills | Status: DC

## 2017-12-07 NOTE — Telephone Encounter (Signed)
No menses with IUD. Mirena IUD placed 02/2012. Reviewed with Dr. Oscar LaJertson. Patient to continue to abstain from intercourse until IUD exchange.   Dawn D. Reviewed benefits of [place] guided IUD exchange, call forwarded to RN for scheduling.  Patient requesting morning appointment.  [place] guided IUD exchange scheduled for 12/30/17 at 9am with Dr. Oscar LaJertson. Advised to take Motrin 800 mg with food and water one hour before procedure. Reviewed cytotec instructions. Patient will continue to abstain from intercourse until IUD exchange.   Routing to provider for final review. Patient is agreeable to disposition. Will close encounter.   Cc: Dawn DingwallSuzy Russo

## 2017-12-07 NOTE — Patient Instructions (Signed)
EXERCISE AND DIET:  We recommended that you start or continue a regular exercise program for good health. Regular exercise means any activity that makes your heart beat faster and makes you sweat.  We recommend exercising at least 30 minutes per day at least 3 days a week, preferably 4 or 5.  We also recommend a diet low in fat and sugar.  Inactivity, poor dietary choices and obesity can cause diabetes, heart attack, stroke, and kidney damage, among others.    ALCOHOL AND SMOKING:  Women should limit their alcohol intake to no more than 7 drinks/beers/glasses of wine (combined, not each!) per week. Moderation of alcohol intake to this level decreases your risk of breast cancer and liver damage. And of course, no recreational drugs are part of a healthy lifestyle.  And absolutely no smoking or even second hand smoke. Most people know smoking can cause heart and lung diseases, but did you know it also contributes to weakening of your bones? Aging of your skin?  Yellowing of your teeth and nails?  CALCIUM AND VITAMIN D:  Adequate intake of calcium and Vitamin D are recommended.  The recommendations for exact amounts of these supplements seem to change often, but generally speaking 600 mg of calcium (either carbonate or citrate) and 800 units of Vitamin D per day seems prudent. Certain women may benefit from higher intake of Vitamin D.  If you are among these women, your doctor will have told you during your visit.    PAP SMEARS:  Pap smears, to check for cervical cancer or precancers,  have traditionally been done yearly, although recent scientific advances have shown that most women can have pap smears less often.  However, every woman still should have a physical exam from her gynecologist every year. It will include a breast check, inspection of the vulva and vagina to check for abnormal growths or skin changes, a visual exam of the cervix, and then an exam to evaluate the size and shape of the uterus and  ovaries.  And after 40 years of age, a rectal exam is indicated to check for rectal cancers. We will also provide age appropriate advice regarding health maintenance, like when you should have certain vaccines, screening for sexually transmitted diseases, bone density testing, colonoscopy, mammograms, etc.   MAMMOGRAMS:  All women over 40 years old should have a yearly mammogram. Many facilities now offer a "3D" mammogram, which may cost around $50 extra out of pocket. If possible,  we recommend you accept the option to have the 3D mammogram performed.  It both reduces the number of women who will be called back for extra views which then turn out to be normal, and it is better than the routine mammogram at detecting truly abnormal areas.    COLONOSCOPY:  Colonoscopy to screen for colon cancer is recommended for all women at age 50.  We know, you hate the idea of the prep.  We agree, BUT, having colon cancer and not knowing it is worse!!  Colon cancer so often starts as a polyp that can be seen and removed at colonscopy, which can quite literally save your life!  And if your first colonoscopy is normal and you have no family history of colon cancer, most women don't have to have it again for 10 years.  Once every ten years, you can do something that may end up saving your life, right?  We will be happy to help you get it scheduled when you are ready.    Be sure to check your insurance coverage so you understand how much it will cost.  It may be covered as a preventative service at no cost, but you should check your particular policy.      Breast Self-Awareness Breast self-awareness means being familiar with how your breasts look and feel. It involves checking your breasts regularly and reporting any changes to your health care provider. Practicing breast self-awareness is important. A change in your breasts can be a sign of a serious medical problem. Being familiar with how your breasts look and feel allows  you to find any problems early, when treatment is more likely to be successful. All women should practice breast self-awareness, including women who have had breast implants. How to do a breast self-exam One way to learn what is normal for your breasts and whether your breasts are changing is to do a breast self-exam. To do a breast self-exam: Look for Changes  1. Remove all the clothing above your waist. 2. Stand in front of a mirror in a room with good lighting. 3. Put your hands on your hips. 4. Push your hands firmly downward. 5. Compare your breasts in the mirror. Look for differences between them (asymmetry), such as: ? Differences in shape. ? Differences in size. ? Puckers, dips, and bumps in one breast and not the other. 6. Look at each breast for changes in your skin, such as: ? Redness. ? Scaly areas. 7. Look for changes in your nipples, such as: ? Discharge. ? Bleeding. ? Dimpling. ? Redness. ? A change in position. Feel for Changes  Carefully feel your breasts for lumps and changes. It is best to do this while lying on your back on the floor and again while sitting or standing in the shower or tub with soapy water on your skin. Feel each breast in the following way:  Place the arm on the side of the breast you are examining above your head.  Feel your breast with the other hand.  Start in the nipple area and make  inch (2 cm) overlapping circles to feel your breast. Use the pads of your three middle fingers to do this. Apply light pressure, then medium pressure, then firm pressure. The light pressure will allow you to feel the tissue closest to the skin. The medium pressure will allow you to feel the tissue that is a little deeper. The firm pressure will allow you to feel the tissue close to the ribs.  Continue the overlapping circles, moving downward over the breast until you feel your ribs below your breast.  Move one finger-width toward the center of the body.  Continue to use the  inch (2 cm) overlapping circles to feel your breast as you move slowly up toward your collarbone.  Continue the up and down exam using all three pressures until you reach your armpit.  Write Down What You Find  Write down what is normal for each breast and any changes that you find. Keep a written record with breast changes or normal findings for each breast. By writing this information down, you do not need to depend only on memory for size, tenderness, or location. Write down where you are in your menstrual cycle, if you are still menstruating. If you are having trouble noticing differences in your breasts, do not get discouraged. With time you will become more familiar with the variations in your breasts and more comfortable with the exam. How often should I examine my breasts? Examine   your breasts every month. If you are breastfeeding, the best time to examine your breasts is after a feeding or after using a breast pump. If you menstruate, the best time to examine your breasts is 5-7 days after your period is over. During your period, your breasts are lumpier, and it may be more difficult to notice changes. When should I see my health care provider? See your health care provider if you notice:  A change in shape or size of your breasts or nipples.  A change in the skin of your breast or nipples, such as a reddened or scaly area.  Unusual discharge from your nipples.  A lump or thick area that was not there before.  Pain in your breasts.  Anything that concerns you.  This information is not intended to replace advice given to you by your health care provider. Make sure you discuss any questions you have with your health care provider. Document Released: 04/20/2005 Document Revised: 09/26/2015 Document Reviewed: 03/10/2015 Elsevier Interactive Patient Education  2018 [employer]  

## 2017-12-09 LAB — CYTOLOGY - PAP
DIAGNOSIS: NEGATIVE
HPV (WINDOPATH): NOT DETECTED

## 2017-12-10 ENCOUNTER — Telehealth: Payer: Self-pay | Admitting: *Deleted

## 2017-12-10 MED ORDER — FLUCONAZOLE 150 MG PO TABS: ORAL_TABLET | ORAL | 0 refills | Status: DC

## 2017-12-10 NOTE — Telephone Encounter (Signed)
Spoke with patient, advised as seen below per Dr. Jertson. Rx for diflucan to verified pharmacy. Patient verbalizes understanding and is agreeable.  Encounter closed.   

## 2017-12-10 NOTE — Telephone Encounter (Signed)
-----   Message from Romualdo BolkJill Evelyn Jertson, MD sent at 12/10/2017  2:28 PM EDT ----- 02 recall She has yeast, she is coming back for IUD removal (strings missing) and reinsertion. Please treat with diflucan 150 mg x 1 repeat x 1 in 72 hours if symptomatic.

## 2017-12-10 NOTE — Telephone Encounter (Signed)
Notes recorded by Leda MinHamm, Owens Hara N, RN on 12/10/2017 at 2:36 PM EDT Left message to call Noreene LarssonJill at [phone].   Next AEX 12/14/18 PUS 12/30/17 at 9am

## 2017-12-30 ENCOUNTER — Other Ambulatory Visit: Payer: Self-pay

## 2017-12-30 ENCOUNTER — Ambulatory Visit (INDEPENDENT_AMBULATORY_CARE_PROVIDER_SITE_OTHER): Payer: BC Managed Care – PPO | Admitting: Obstetrics and Gynecology

## 2017-12-30 ENCOUNTER — Ambulatory Visit (INDEPENDENT_AMBULATORY_CARE_PROVIDER_SITE_OTHER): Payer: BC Managed Care – PPO

## 2017-12-30 ENCOUNTER — Encounter: Payer: Self-pay | Admitting: Obstetrics and Gynecology

## 2017-12-30 VITALS — BP 120/80 | HR 82

## 2017-12-30 DIAGNOSIS — T8332XD Displacement of intrauterine contraceptive device, subsequent encounter: Secondary | ICD-10-CM | POA: Diagnosis not present

## 2017-12-30 DIAGNOSIS — T8332XA Displacement of intrauterine contraceptive device, initial encounter: Secondary | ICD-10-CM | POA: Diagnosis not present

## 2017-12-30 DIAGNOSIS — Z30433 Encounter for removal and reinsertion of intrauterine contraceptive device: Secondary | ICD-10-CM

## 2017-12-30 DIAGNOSIS — Z3009 Encounter for other general counseling and advice on contraception: Secondary | ICD-10-CM | POA: Diagnosis not present

## 2017-12-30 DIAGNOSIS — N898 Other specified noninflammatory disorders of vagina: Secondary | ICD-10-CM | POA: Diagnosis not present

## 2017-12-30 DIAGNOSIS — N882 Stricture and stenosis of cervix uteri: Secondary | ICD-10-CM

## 2017-12-30 MED ORDER — LEVONORGEST-ETH ESTRAD 91-DAY 0.1-0.02 & 0.01 MG PO TABS: 1.0000 | ORAL_TABLET | Freq: Every day | ORAL | 0 refills | Status: DC

## 2017-12-30 NOTE — Progress Notes (Signed)
GYNECOLOGY  VISIT   HPI: 42 y.o.   Married Caucasian  female   [phone] with No LMP recorded. (Menstrual status: IUD).   here for ultrasound guided IUD exchange. IUD strings missing. The patient was treated for yeast on her pap, she c/o vaginal odor. No abnormal vaginal discharge, no itching, burning or irritation.   Prior to the mirena she had very heavy and crampy cycles. Cycles would last for 10-12 days.   GYNECOLOGIC HISTORY: No LMP recorded. (Menstrual status: IUD). Contraception:IUD Menopausal hormone therapy: none        OB History    Gravida  4   Para  3   Term  3   Preterm  0   AB  1   Living  3     SAB      TAB      Ectopic      Multiple      Live Births  3              Patient Active Problem List   Diagnosis Date Noted  . Healthcare maintenance 11/29/2017  . Crying with unclear etiology 11/29/2017  . Screening examination for STD (sexually transmitted disease) 11/29/2017    Past Medical History:  Diagnosis Date  . Blood transfusion without reported diagnosis     Past Surgical History:  Procedure Laterality Date  . CESAREAN SECTION    . CHOLECYSTECTOMY      Current Outpatient Medications  Medication Sig Dispense Refill  . ibuprofen (ADVIL,MOTRIN) 600 MG tablet Take 600 mg by mouth every 6 (six) hours as needed.    . Levonorgestrel-Ethinyl Estradiol (LOSEASONIQUE) 0.1-0.02 & 0.01 MG tablet Take 1 tablet by mouth daily. 1 Package 0   No current facility-administered medications for this visit.      ALLERGIES: Patient has no known allergies.  Family History  Problem Relation Age of Onset  . Stroke Mother     Social History   Socioeconomic History  . Marital status: Married    Spouse name: Not on file  . Number of children: Not on file  . Years of education: Not on file  . Highest education level: Not on file  Occupational History  . Not on file  Social Needs  . Financial resource strain: Not on file  . Food insecurity:     Worry: Not on file    Inability: Not on file  . Transportation needs:    Medical: Not on file    Non-medical: Not on file  Tobacco Use  . Smoking status: Former Smoker    Packs/day: 1.00    Years: 19.00    Pack years: 19.00    Types: Cigarettes  . Smokeless tobacco: Never Used  Substance and Sexual Activity  . Alcohol use: No  . Drug use: No  . Sexual activity: Yes    Birth control/protection: IUD    Comment: Mirena  Lifestyle  . Physical activity:    Days per week: Not on file    Minutes per session: Not on file  . Stress: Not on file  Relationships  . Social connections:    Talks on phone: Not on file    Gets together: Not on file    Attends religious service: Not on file    Active member of club or organization: Not on file    Attends meetings of clubs or organizations: Not on file    Relationship status: Not on file  . Intimate partner violence:  Fear of current or ex partner: Not on file    Emotionally abused: Not on file    Physically abused: Not on file    Forced sexual activity: Not on file  Other Topics Concern  . Not on file  Social History Narrative  . Not on file    Review of Systems  Constitutional: Negative.   HENT: Negative.   Eyes: Negative.   Respiratory: Negative.   Cardiovascular: Negative.   Gastrointestinal: Negative.   Genitourinary: Negative.   Musculoskeletal: Negative.   Skin: Negative.   Neurological: Negative.   Endo/Heme/Allergies: Negative.   Psychiatric/Behavioral: Negative.   All other systems reviewed and are negative.   PHYSICAL EXAMINATION:    BP 120/80   Pulse 82     General appearance: alert, cooperative and appears stated age  Pelvic: External genitalia:  no lesions              Urethra:  normal appearing urethra with no masses, tenderness or lesions              Bartholins and Skenes: normal                 Vagina: normal appearing vagina with normal color and discharge, no lesions              Cervix: no  lesions  Wet prep: no clear clue, no trich, + wbc KOH: no yeast PH: 5 (but spotting)  The risks of the mirena IUD removal under ultrasound guidance and insertion were reviewed with the patient. Risks include, but are not limited to: infection, abnormal bleeding and uterine perfortion. Consent was signed.  A speculum was placed in the vagina, the cervix was cleansed with betadine. A tenaculum was placed on the cervix. The IUD string could not be removed from the cervical canal. The cervix was dilated to a 5 hagar dilator and the IUD hook was used to retrieve the IUD. This was done with ultrasound guidance. The patient was very uncomfortable with removal. A paracervical block was placed using 8 cc of lidocaine (injections at 2, 4, 8 and 10 o'clock) The uterus sounded to 6 cm. The mirena IUD was inserted with some difficulty. The patient has had 3 prior C/S and her cervix has a slight curve (up then down). The IUD applicator was not as easy to place as the dilators. The IUD was deployed and the strings were cut to 3-4 cm. The tenaculum was removed. Slight oozing from the tenaculum site was stopped with pressure. This was all done under ultrasound guidance abdominally, but the deployment of the IUD was hard to see. TV ultrasound after insertion, showed the IUD in her uterus, it was hard to tell if it was in her cavity or right above her cavity, there was some clot in her uterus.  The patient was not in pain after the insertion. She will hydrate and come back in several hours to try and get better visualization.   The patient tolerated the procedure well.    Chaperone was present for exam.  ASSESSMENT IUD strings lost, IUD removed with ultrasound guidance IUD insertion was difficult, will return for repeat ultrasound to confirm placement later today Vaginal odor, no clear vaginitis on slides   PLAN Patient returned for f/u ultrasound. IUD was not in place, IUD in her cervix, Arms not in place.   IUD removed with ringed forceps easily Will try the 3 month pill, no contraindications, risks reviewed F/U in 3 months If  she wants to try the IUD will insert in the OR Affirm sent   An After Visit Summary was printed and given to the patient.  Over 15 minutes in counseling   Addendum: patient states her last IUD had to be inserted in the OR

## 2017-12-30 NOTE — Patient Instructions (Signed)
Oral Contraception Information Oral contraceptive pills (OCPs) are medicines taken to prevent pregnancy. OCPs work by preventing the ovaries from releasing eggs. The hormones in OCPs also cause the cervical mucus to thicken, preventing the sperm from entering the uterus. The hormones also cause the uterine lining to become thin, not allowing a fertilized egg to attach to the inside of the uterus. OCPs are highly effective when taken exactly as prescribed. However, OCPs do not prevent sexually transmitted diseases (STDs). Safe sex practices, such as using condoms along with the pill, can help prevent STDs. Before taking the pill, you may have a physical exam and Pap test. Your health care provider may order blood tests. The health care provider will make sure you are a good candidate for oral contraception. Discuss with your health care provider the possible side effects of the OCP you may be prescribed. When starting an OCP, it can take 2 to 3 months for the body to adjust to the changes in hormone levels in your body. Types of oral contraception  The combination pill-This pill contains estrogen and progestin (synthetic progesterone) hormones. The combination pill comes in 21-day, 28-day, or 91-day packs. Some types of combination pills are meant to be taken continuously (365-day pills). With 21-day packs, you do not take pills for 7 days after the last pill. With 28-day packs, the pill is taken every day. The last 7 pills are without hormones. Certain types of pills have more than 21 hormone-containing pills. With 91-day packs, the first 84 pills contain both hormones, and the last 7 pills contain no hormones or contain estrogen only.  The minipill-This pill contains the progesterone hormone only. The pill is taken every day continuously. It is very important to take the pill at the same time each day. The minipill comes in packs of 28 pills. All 28 pills contain the hormone. Advantages of oral  contraceptive pills  Decreases premenstrual symptoms.  Treats menstrual period cramps.  Regulates the menstrual cycle.  Decreases a heavy menstrual flow.  May treatacne, depending on the type of pill.  Treats abnormal uterine bleeding.  Treats polycystic ovarian syndrome.  Treats endometriosis.  Can be used as emergency contraception. Things that can make oral contraceptive pills less effective OCPs can be less effective if:  You forget to take the pill at the same time every day.  You have a stomach or intestinal disease that lessens the absorption of the pill.  You take OCPs with other medicines that make OCPs less effective, such as antibiotics, certain HIV medicines, and some seizure medicines.  You take expired OCPs.  You forget to restart the pill on day 7, when using the packs of 21 pills.  Risks associated with oral contraceptive pills Oral contraceptive pills can sometimes cause side effects, such as:  Headache.  Nausea.  Breast tenderness.  Irregular bleeding or spotting.  Combination pills are also associated with a small increased risk of:  Blood clots.  Heart attack.  Stroke.  This information is not intended to replace advice given to you by your health care provider. Make sure you discuss any questions you have with your health care provider. Document Released: 07/11/2002 Document Revised: 09/26/2015 Document Reviewed: 10/09/2012 Elsevier Interactive Patient Education  2018 [employer]vier Inc.  

## 2018-01-01 LAB — VAGINITIS/VAGINOSIS, DNA PROBE
Candida Species: NEGATIVE
GARDNERELLA VAGINALIS: POSITIVE — AB
Trichomonas vaginosis: NEGATIVE

## 2018-01-04 ENCOUNTER — Telehealth: Payer: Self-pay | Admitting: *Deleted

## 2018-01-04 NOTE — Telephone Encounter (Signed)
-----   Message from Romualdo Bolk, MD sent at 01/04/2018  7:28 AM EDT ----- Please inform the patient that her vaginitis probe was + for BV and treat with flagyl (either oral or vaginal, her choice), no ETOH while on Flagyl.  Oral: Flagyl 500 mg BID x 7 days, or Vaginal: Metrogel, 1 applicator per vagina q day x 5 days.

## 2018-01-04 NOTE — Telephone Encounter (Signed)
Left message to call Wilmetta Speiser at [phone].  

## 2018-01-05 ENCOUNTER — Encounter: Payer: Self-pay | Admitting: Obstetrics and Gynecology

## 2018-01-05 ENCOUNTER — Telehealth: Payer: Self-pay | Admitting: Obstetrics and Gynecology

## 2018-01-05 MED ORDER — METRONIDAZOLE 500 MG PO TABS: 500.0000 mg | ORAL_TABLET | Freq: Two times a day (BID) | ORAL | 0 refills | Status: DC

## 2018-01-05 NOTE — Telephone Encounter (Signed)
Dr. Oscar La -please review. Per OV note dated 12/30/17, will try IUD insertion in OR, ok top proceed with precert? Does this need to be scheduled with next menses?

## 2018-01-05 NOTE — Telephone Encounter (Signed)
Patient sent the following correspondence through MyChart. Routing to triage to assist patient with request.  Hi Dr.     I wanted to just go ahead and ask if it would be possible to go ahead and schedule the Mirena insertion procedure. I have already missed 3 pills and me and my husband think it would be best to go ahead with the Mirena procedure. Thanks for your time and all you have done for me.

## 2018-01-05 NOTE — Telephone Encounter (Signed)
Spoke with patient, advised as seen below per Dr. Oscar La. Rx for Flagyl PO to verified pharmacy. ETOH precautions reviewed. Patient verbalizes understanding and is agreeable. Encounter closed.

## 2018-01-06 NOTE — Telephone Encounter (Signed)
Have confirmed Mirena IUD availability through [employer]  Call to patient to review pharmacy information and discuss scheduling. Per ROI, can leave message on voice mail. Left message calling regarding scheduling.

## 2018-01-06 NOTE — Telephone Encounter (Signed)
Yes, please set up in the OR with ultrasound guidance during her cycle. I would pre-treat with cytotec  CC: Billie Ruddy, RN

## 2018-01-07 MED ORDER — MISOPROSTOL 200 MCG PO TABS: ORAL_TABLET | ORAL | 0 refills | Status: DC

## 2018-01-07 MED ORDER — LEVONORGESTREL 20 MCG/24HR IU IUD: INTRAUTERINE_SYSTEM | INTRAUTERINE | 0 refills | Status: AC

## 2018-01-07 MED FILL — MIRENA SYSTEM: 20 | 90 days supply | Qty: 1 | Fill #0

## 2018-01-07 MED FILL — miSOPROStol 200 MCG TABS: 200 | 2 days supply | Qty: 2 | Fill #0

## 2018-01-07 NOTE — Telephone Encounter (Signed)
Call from patient. Discussed Mirena coverage at [facility] out patient pharmacy. Patient will pick up.  Discussed date option of 01-17-18. Patient is abstaining from intercourse. Agrees to 01-17-18. Will schedule and call her back to confirm.

## 2018-01-10 ENCOUNTER — Encounter (HOSPITAL_BASED_OUTPATIENT_CLINIC_OR_DEPARTMENT_OTHER): Payer: Self-pay

## 2018-01-10 NOTE — Telephone Encounter (Signed)
Call from patient. Surgery instruction sheet reviewed and printed copy will be provided at consult appointment.  Patient is aware to pick up Mirena from pharmacy.    Routing to provider for final review. Will close encounter.

## 2018-01-10 NOTE — Telephone Encounter (Signed)
Call to patient. Advised surgery is confirmed for Monday 01-17-18 at 1015. Patient is on phone with hospital and will call back.

## 2018-01-11 NOTE — Progress Notes (Deleted)
GYNECOLOGY  VISIT   HPI: 42 y.o.   Married White or Caucasian Not Hispanic or Latino  female   [phone] with No LMP recorded. (Menstrual status: IUD).   here for surgical consult.   GYNECOLOGIC HISTORY: No LMP recorded. (Menstrual status: IUD). Contraception:OCP Menopausal hormone therapy: none        OB History    Gravida  4   Para  3   Term  3   Preterm  0   AB  1   Living  3     SAB      TAB      Ectopic      Multiple      Live Births  3              Patient Active Problem List   Diagnosis Date Noted  . Healthcare maintenance 11/29/2017  . Crying with unclear etiology 11/29/2017  . Screening examination for STD (sexually transmitted disease) 11/29/2017    Past Medical History:  Diagnosis Date  . Blood transfusion without reported diagnosis   . History of gallstones     Past Surgical History:  Procedure Laterality Date  . CESAREAN SECTION    . CHOLECYSTECTOMY      Current Outpatient Medications  Medication Sig Dispense Refill  . ibuprofen (ADVIL,MOTRIN) 600 MG tablet Take 600 mg by mouth every 6 (six) hours as needed.    Marland Kitchen levonorgestrel (MIRENA, 52 MG,) 20 MCG/24HR IUD Dispense one device for physician insertion in OR. 1 each 0  . Levonorgestrel-Ethinyl Estradiol (LOSEASONIQUE) 0.1-0.02 & 0.01 MG tablet Take 1 tablet by mouth daily. 1 Package 0  . metroNIDAZOLE (FLAGYL) 500 MG tablet Take 1 tablet (500 mg total) by mouth 2 (two) times daily. 14 tablet 0  . misoprostol (CYTOTEC) 200 MCG tablet Insert two tablets vaginally evening before procedure. 2 tablet 0   No current facility-administered medications for this visit.      ALLERGIES: Patient has no known allergies.  Family History  Problem Relation Age of Onset  . Stroke Mother     Social History   Socioeconomic History  . Marital status: Married    Spouse name: Not on file  . Number of children: Not on file  . Years of education: Not on file  . Highest education level: Not on  file  Occupational History  . Not on file  Social Needs  . Financial resource strain: Not on file  . Food insecurity:    Worry: Not on file    Inability: Not on file  . Transportation needs:    Medical: Not on file    Non-medical: Not on file  Tobacco Use  . Smoking status: Former Smoker    Packs/day: 1.00    Years: 19.00    Pack years: 19.00    Types: Cigarettes  . Smokeless tobacco: Never Used  Substance and Sexual Activity  . Alcohol use: No  . Drug use: No  . Sexual activity: Yes    Birth control/protection: IUD    Comment: Mirena  Lifestyle  . Physical activity:    Days per week: Not on file    Minutes per session: Not on file  . Stress: Not on file  Relationships  . Social connections:    Talks on phone: Not on file    Gets together: Not on file    Attends religious service: Not on file    Active member of club or organization: Not on file  Attends meetings of clubs or organizations: Not on file    Relationship status: Not on file  . Intimate partner violence:    Fear of current or ex partner: Not on file    Emotionally abused: Not on file    Physically abused: Not on file    Forced sexual activity: Not on file  Other Topics Concern  . Not on file  Social History Narrative  . Not on file    Review of Systems  Constitutional: Negative.   HENT: Negative.   Eyes: Negative.   Respiratory: Negative.   Cardiovascular: Negative.   Gastrointestinal: Negative.   Endocrine: Negative.   Genitourinary: Negative.   Musculoskeletal: Negative.   Skin: Negative.   Allergic/Immunologic: Negative.   Neurological: Negative.   Hematological: Negative.   Psychiatric/Behavioral: Negative.   All other systems reviewed and are negative.   PHYSICAL EXAMINATION:    There were no vitals taken for this visit.    General appearance: alert, cooperative and appears stated age Neck: no adenopathy, supple, symmetrical, trachea midline and thyroid {CHL AMB PHY EX THYROID  NORM DEFAULT:[phone]::"normal to inspection and palpation"} Breasts: {Exam; breast:13139::"normal appearance, no masses or tenderness"} Abdomen: soft, non-tender; non distended, no masses,  no organomegaly  Pelvic: External genitalia:  no lesions              Urethra:  normal appearing urethra with no masses, tenderness or lesions              Bartholins and Skenes: normal                 Vagina: normal appearing vagina with normal color and discharge, no lesions              Cervix: {CHL AMB PHY EX CERVIX NORM DEFAULT:[phone]::"no lesions"}              Bimanual Exam:  Uterus:  {CHL AMB PHY EX UTERUS NORM [phone]::"normal size, contour, position, consistency, mobility, non-tender"}              Adnexa: {CHL AMB PHY EX ADNEXA NO MASS [phone]::"no mass, fullness, tenderness"}              Rectovaginal: {yes no:314532}.  Confirms.              Anus:  normal sphincter tone, no lesions  Chaperone was present for exam.  ASSESSMENT     PLAN    An After Visit Summary was printed and given to the patient.  *** minutes face to face time of which over 50% was spent in counseling.

## 2018-01-12 ENCOUNTER — Telehealth: Payer: Self-pay | Admitting: *Deleted

## 2018-01-12 NOTE — Telephone Encounter (Signed)
Patient returned call to office. Surgery information sheet reviewed with patient and she verbalized understanding. Advised patient per Dr. Oscar La, will cancel pre op scheduled for 01-13-18. Patient agreeable. Patient states she will bring IUD with her to [facility] on Monday. Patient ready to pay surgery pre payment. Phone call transferred to front desk for payment.   Routing to provider for final review. Patient agreeable to disposition. Will close encounter.    CC Billie Ruddy, RN

## 2018-01-12 NOTE — Telephone Encounter (Signed)
Message left to return call to Emily at [phone].    

## 2018-01-12 NOTE — Telephone Encounter (Signed)
Call to patient. Left message to call back.  Calling to review instructions prior to surgery.

## 2018-01-12 NOTE — Telephone Encounter (Signed)
Patient returning call to Sally. °

## 2018-01-13 ENCOUNTER — Other Ambulatory Visit: Payer: Self-pay

## 2018-01-13 ENCOUNTER — Encounter (HOSPITAL_BASED_OUTPATIENT_CLINIC_OR_DEPARTMENT_OTHER): Payer: Self-pay

## 2018-01-13 ENCOUNTER — Encounter: Payer: BC Managed Care – PPO | Admitting: Adult Health

## 2018-01-13 ENCOUNTER — Ambulatory Visit: Payer: BC Managed Care – PPO | Admitting: Obstetrics and Gynecology

## 2018-01-13 NOTE — Progress Notes (Signed)
Spoke with:  Rin NPO:  After Midnight, no gum, candy, or mints   Arrival time:  [id number] Labs:  UPT AM medications: None Pre op orders: Yes Ride home:  Onalee HuaDavid (husband) [phone]

## 2018-01-16 NOTE — Anesthesia Preprocedure Evaluation (Addendum)
Anesthesia Evaluation  Patient identified by MRN, date of birth, ID band Patient awake    Reviewed: Allergy & Precautions, NPO status , Patient's Chart, lab work & pertinent test results  History of Anesthesia Complications (+) PONV  Airway Mallampati: II  TM Distance: >3 FB Neck ROM: Full    Dental no notable dental hx.    Pulmonary former smoker,    Pulmonary exam normal breath sounds clear to auscultation       Cardiovascular Exercise Tolerance: Good negative cardio ROS Normal cardiovascular exam Rhythm:Regular Rate:Normal     Neuro/Psych negative neurological ROS  negative psych ROS   GI/Hepatic negative GI ROS,   Endo/Other    Renal/GU   negative genitourinary   Musculoskeletal   Abdominal   Peds  Hematology  (+) anemia ,   Anesthesia Other Findings   Reproductive/Obstetrics                            Anesthesia Physical Anesthesia Plan  ASA: I  Anesthesia Plan: MAC   Post-op Pain Management:    Induction: Intravenous  PONV Risk Score and Plan: 4 or greater and Treatment may vary due to age or medical condition, Ondansetron, Dexamethasone, Scopolamine patch - Pre-op and TIVA  Airway Management Planned: Natural Airway and Nasal Cannula  Additional Equipment:   Intra-op Plan:   Post-operative Plan:   Informed Consent: I have reviewed the patients History and Physical, chart, labs and discussed the procedure including the risks, benefits and alternatives for the proposed anesthesia with the patient or authorized representative who has indicated his/her understanding and acceptance.   Dental advisory given  Plan Discussed with:   Anesthesia Plan Comments:       Anesthesia Quick Evaluation

## 2018-01-17 ENCOUNTER — Ambulatory Visit (HOSPITAL_BASED_OUTPATIENT_CLINIC_OR_DEPARTMENT_OTHER): Payer: BC Managed Care – PPO | Admitting: Anesthesiology

## 2018-01-17 ENCOUNTER — Encounter (HOSPITAL_BASED_OUTPATIENT_CLINIC_OR_DEPARTMENT_OTHER): Payer: Self-pay | Admitting: *Deleted

## 2018-01-17 ENCOUNTER — Other Ambulatory Visit: Payer: Self-pay

## 2018-01-17 ENCOUNTER — Ambulatory Visit (HOSPITAL_BASED_OUTPATIENT_CLINIC_OR_DEPARTMENT_OTHER)
Admission: RE | Admit: 2018-01-17 | Discharge: 2018-01-17 | Disposition: A | Discharge status: Home / Self Care | Payer: BC Managed Care – PPO | Source: Ambulatory Visit | Attending: Obstetrics and Gynecology | Admitting: Obstetrics and Gynecology

## 2018-01-17 ENCOUNTER — Encounter (HOSPITAL_BASED_OUTPATIENT_CLINIC_OR_DEPARTMENT_OTHER)
Admission: RE | Disposition: A | Discharge status: Home / Self Care | Payer: Self-pay | Source: Ambulatory Visit | Attending: Obstetrics and Gynecology

## 2018-01-17 ENCOUNTER — Ambulatory Visit (HOSPITAL_COMMUNITY)
Admit: 2018-01-17 | Discharge: 2018-01-17 | Disposition: A | Discharge status: Home / Self Care | Payer: BC Managed Care – PPO | Attending: Obstetrics and Gynecology | Admitting: Obstetrics and Gynecology

## 2018-01-17 DIAGNOSIS — Z87891 Personal history of nicotine dependence: Secondary | ICD-10-CM | POA: Insufficient documentation

## 2018-01-17 DIAGNOSIS — Z30430 Encounter for insertion of intrauterine contraceptive device: Secondary | ICD-10-CM | POA: Diagnosis not present

## 2018-01-17 DIAGNOSIS — Z975 Presence of (intrauterine) contraceptive device: Secondary | ICD-10-CM

## 2018-01-17 HISTORY — PX: INTRAUTERINE DEVICE (IUD) INSERTION: SHX5877

## 2018-01-17 HISTORY — DX: Personal history of other diseases of the digestive system: Z87.19

## 2018-01-17 HISTORY — DX: Nausea with vomiting, unspecified: R11.2

## 2018-01-17 HISTORY — DX: Other specified postprocedural states: Z98.890

## 2018-01-17 HISTORY — DX: Anemia, unspecified: D64.9

## 2018-01-17 LAB — POCT PREGNANCY, URINE: Preg Test, Ur: NEGATIVE

## 2018-01-17 SURGERY — INSERTION, INTRAUTERINE DEVICE
Anesthesia: Monitor Anesthesia Care | Site: Uterus

## 2018-01-17 MED ORDER — PROPOFOL 500 MG/50ML IV EMUL
INTRAVENOUS | Status: DC | PRN
  Administered 2018-01-17: 200 ug/kg/min via INTRAVENOUS

## 2018-01-17 MED ORDER — FENTANYL CITRATE (PF) 100 MCG/2ML IJ SOLN
INTRAMUSCULAR | Status: DC | PRN
  Administered 2018-01-17: 50 ug via INTRAVENOUS

## 2018-01-17 MED ORDER — MIDAZOLAM HCL 2 MG/2ML IJ SOLN
INTRAMUSCULAR | Status: AC
  Filled 2018-01-17: qty 2

## 2018-01-17 MED ORDER — HYDROMORPHONE HCL 1 MG/ML IJ SOLN
INTRAMUSCULAR | Status: AC
  Filled 2018-01-17: qty 1

## 2018-01-17 MED ORDER — PROMETHAZINE HCL 25 MG/ML IJ SOLN
6.2500 mg | INTRAMUSCULAR | Status: DC | PRN
  Filled 2018-01-17: qty 1

## 2018-01-17 MED ORDER — LIDOCAINE 2% (20 MG/ML) 5 ML SYRINGE
INTRAMUSCULAR | Status: AC
  Filled 2018-01-17: qty 5

## 2018-01-17 MED ORDER — LIDOCAINE HCL (CARDIAC) PF 100 MG/5ML IV SOSY
PREFILLED_SYRINGE | INTRAVENOUS | Status: DC | PRN
  Administered 2018-01-17: 50 mg via INTRAVENOUS

## 2018-01-17 MED ORDER — KETOROLAC TROMETHAMINE 30 MG/ML IJ SOLN
INTRAMUSCULAR | Status: AC
  Filled 2018-01-17: qty 1

## 2018-01-17 MED ORDER — MEPERIDINE HCL 25 MG/ML IJ SOLN
6.2500 mg | INTRAMUSCULAR | Status: DC | PRN
  Filled 2018-01-17: qty 1

## 2018-01-17 MED ORDER — ACETAMINOPHEN 500 MG PO TABS
1000.0000 mg | ORAL_TABLET | Freq: Once | ORAL | Status: AC
  Administered 2018-01-17: 1000 mg via ORAL
  Filled 2018-01-17: qty 2

## 2018-01-17 MED ORDER — HYDROCODONE-ACETAMINOPHEN 7.5-325 MG PO TABS
1.0000 | ORAL_TABLET | Freq: Once | ORAL | Status: AC | PRN
  Administered 2018-01-17: 1 via ORAL
  Filled 2018-01-17: qty 1

## 2018-01-17 MED ORDER — ONDANSETRON HCL 4 MG/2ML IJ SOLN
INTRAMUSCULAR | Status: AC
  Filled 2018-01-17: qty 2

## 2018-01-17 MED ORDER — MIDAZOLAM HCL 5 MG/5ML IJ SOLN
INTRAMUSCULAR | Status: DC | PRN
  Administered 2018-01-17: 2 mg via INTRAVENOUS

## 2018-01-17 MED ORDER — PROPOFOL 10 MG/ML IV BOLUS
INTRAVENOUS | Status: AC
  Filled 2018-01-17: qty 20

## 2018-01-17 MED ORDER — DEXAMETHASONE SODIUM PHOSPHATE 4 MG/ML IJ SOLN
INTRAMUSCULAR | Status: DC | PRN
  Administered 2018-01-17: 10 mg via INTRAVENOUS

## 2018-01-17 MED ORDER — DEXAMETHASONE SODIUM PHOSPHATE 10 MG/ML IJ SOLN
INTRAMUSCULAR | Status: AC
  Filled 2018-01-17: qty 1

## 2018-01-17 MED ORDER — ACETAMINOPHEN 500 MG PO TABS
ORAL_TABLET | ORAL | Status: AC
  Filled 2018-01-17: qty 2

## 2018-01-17 MED ORDER — KETOROLAC TROMETHAMINE 30 MG/ML IJ SOLN
INTRAMUSCULAR | Status: DC | PRN
  Administered 2018-01-17: 30 mg via INTRAVENOUS

## 2018-01-17 MED ORDER — HYDROMORPHONE HCL 1 MG/ML IJ SOLN
0.2500 mg | INTRAMUSCULAR | Status: DC | PRN
  Administered 2018-01-17 (×2): 0.25 mg via INTRAVENOUS
  Filled 2018-01-17: qty 0.5

## 2018-01-17 MED ORDER — ONDANSETRON HCL 4 MG/2ML IJ SOLN
INTRAMUSCULAR | Status: DC | PRN
  Administered 2018-01-17: 4 mg via INTRAVENOUS

## 2018-01-17 MED ORDER — PROPOFOL 10 MG/ML IV BOLUS
INTRAVENOUS | Status: DC | PRN
  Administered 2018-01-17 (×2): 20 mg via INTRAVENOUS

## 2018-01-17 MED ORDER — FENTANYL CITRATE (PF) 100 MCG/2ML IJ SOLN
INTRAMUSCULAR | Status: AC
  Filled 2018-01-17: qty 2

## 2018-01-17 MED ORDER — LIDOCAINE HCL (PF) 1 % IJ SOLN
INTRAMUSCULAR | Status: DC | PRN
  Administered 2018-01-17: 8 mL

## 2018-01-17 MED ORDER — HYDROCODONE-ACETAMINOPHEN 7.5-325 MG PO TABS
ORAL_TABLET | ORAL | Status: AC
  Filled 2018-01-17: qty 1

## 2018-01-17 MED ORDER — PROPOFOL 500 MG/50ML IV EMUL
INTRAVENOUS | Status: AC
  Filled 2018-01-17: qty 50

## 2018-01-17 MED ORDER — ACETAMINOPHEN 10 MG/ML IV SOLN
1000.0000 mg | Freq: Once | INTRAVENOUS | Status: DC | PRN
  Filled 2018-01-17: qty 100

## 2018-01-17 MED ORDER — LACTATED RINGERS IV SOLN
INTRAVENOUS | Status: DC
  Administered 2018-01-17 (×2): via INTRAVENOUS
  Filled 2018-01-17: qty 1000

## 2018-01-17 SURGICAL SUPPLY — 14 items
CANISTER SUCT 3000ML PPV (MISCELLANEOUS) ×6 IMPLANT
CATH ROBINSON RED A/P 16FR (CATHETERS) IMPLANT
DILATOR CANAL MILEX (MISCELLANEOUS) IMPLANT
GAUZE SPONGE 4X4 16PLY XRAY LF (GAUZE/BANDAGES/DRESSINGS) ×3 IMPLANT
GLOVE BIO SURGEON STRL SZ 6.5 (GLOVE) ×2 IMPLANT
GLOVE BIO SURGEONS STRL SZ 6.5 (GLOVE) ×1
GOWN STRL REUS W/TWL LRG LVL3 (GOWN DISPOSABLE) ×3 IMPLANT
KIT TURNOVER CYSTO (KITS) ×3 IMPLANT
PACK VAGINAL MINOR WOMEN LF (CUSTOM PROCEDURE TRAY) ×3 IMPLANT
PAD OB MATERNITY 4.3X12.25 (PERSONAL CARE ITEMS) ×3 IMPLANT
PAD PREP 24X48 CUFFED NSTRL (MISCELLANEOUS) ×3 IMPLANT
TOWEL OR 17X24 6PK STRL BLUE (TOWEL DISPOSABLE) ×6 IMPLANT
WATER STERILE IRR 500ML POUR (IV SOLUTION) IMPLANT
mirena ×2 IMPLANT

## 2018-01-17 NOTE — Op Note (Signed)
Preoperative Diagnosis: Desires Contraception, unable to insert IUD properly in the office  Postoperative Diagnosis: Same  Procedure: Mirena IUD insertion under ultrasound guidance  Surgeon: Dr Sumner Boast  Assistants: None  Anesthesia: MAC and paracervical  EBL: minimal  Fluids: 400 cc  Indications for surgery: The patient is a 42 yo female, with a history of 3 prior cesarean sections and difficult IUD insertion. An attempt was made at office IUD insertion, it was not placed correctly.  The risks of the surgery were reviewed with the patient and the consent form was signed prior to her surgery.  Findings: EUA: normal sized uterus, no adnexal masses. Ultrasound normal uterus, the curve of the cervix was well seen. IUD properly inserted with ultrasound guidance, in place with transvaginal ultrasound.   Procedure: The patient was taken to the operating room with an IV in place. She was placed in the dorsal lithotomy position and anesthesia was administered. She was prepped and draped in the usual sterile fashion for a vaginal procedure. Her bladder was filled with sterile water.  A weighted speculum was placed in the vagina and a single tooth tenaculum was placed on the anterior lip of the cervix. The cervix was dilated to a #6.5 hagar dilator under ultrasound guidance. The uterus was sounded to 7-8 cm. The mirena IUD was inserted under ultrasound guidance. The single tooth tenaculum was removed. Oozing from the tenaculum site was stopped with pressure. The speculum was removed. Transvaginal ultrasound confirmed proper IUD location. The patients perineum was cleansed of betadine and she was taken out of the dorsal lithotomy position.  Upon awakening the patient was transferred to the recovery room in stable and awake condition.  The sponge and instrument count were correct. There were no complications.

## 2018-01-17 NOTE — Anesthesia Postprocedure Evaluation (Signed)
Anesthesia Post Note  Patient: Torianna Junio  Procedure(s) Performed: INTRAUTERINE DEVICE (IUD) INSERTION with ultrasound guidance (N/A Uterus)     Patient location during evaluation: PACU Anesthesia Type: MAC Level of consciousness: awake and alert Pain management: pain level controlled Vital Signs Assessment: post-procedure vital signs reviewed and stable Respiratory status: spontaneous breathing, nonlabored ventilation, respiratory function stable and patient connected to nasal cannula oxygen Cardiovascular status: stable and blood pressure returned to baseline Postop Assessment: no apparent nausea or vomiting Anesthetic complications: no    Last Vitals:  Vitals:   01/17/18 0811 01/17/18 0949  BP: 131/76 109/62  Pulse: 70 81  Resp: 16 15  Temp: 37.1 C 37 C  SpO2: 100% 100%    Last Pain:  Vitals:   01/17/18 0949  TempSrc:   PainSc: Pacheco A Houser

## 2018-01-17 NOTE — Discharge Instructions (Signed)

## 2018-01-17 NOTE — Transfer of Care (Signed)
   Last Vitals:  Vitals Value Taken Time  BP    Temp 37 C 01/17/2018  9:49 AM  Pulse 77 01/17/2018  9:51 AM  Resp 16 01/17/2018  9:51 AM  SpO2 100 % 01/17/2018  9:51 AM  Vitals shown include unvalidated device data.  Last Pain:  Vitals:   01/17/18 0830  TempSrc:   PainSc: 4       Patients Stated Pain Goal: 7 (01/17/18 0830)  Immediate Anesthesia Transfer of Care Note  Patient: Dawn Russo  Procedure(s) Performed: Procedure(s) (LRB): INTRAUTERINE DEVICE (IUD) INSERTION with ultrasound guidance (N/A)  Patient Location: PACU  Anesthesia Type:MAC  Level of Consciousness: awake, alert  and oriented  Airway & Oxygen Therapy: Patient Spontanous Breathing and Patient connected to nasal cannula  oxygen  Post-op Assessment: Report given to PACU RN and Post -op Vital signs reviewed and stable  Post vital signs: Reviewed and stable  Complications: No apparent anesthesia complications

## 2018-01-17 NOTE — H&P (Signed)
GYNECOLOGY  VISIT   HPI: 42 y.o.   Married White or Caucasian Not Hispanic or Latino  female   [phone] with No LMP recorded. (Menstrual status: IUD).   here for mirena IUD insertion under ultrasound guidance. The patient has a h/o c/s x 3, prior h/o difficult IUD removal and insertion done in the OR. On 12/30/17 the patient's old IUD was removed with ultrasound guidance, but unable to successfully insert the IUD into the cavity. It was in the cervix.     GYNECOLOGIC HISTORY: No LMP recorded. (Menstrual status: IUD). Contraception:IUD removed on 12/30/17 Menopausal hormone therapy: NA        OB History    Gravida  4   Para  3   Term  3   Preterm  0   AB  1   Living  3     SAB      TAB      Ectopic      Multiple      Live Births  3              Patient Active Problem List   Diagnosis Date Noted  . Healthcare maintenance 11/29/2017  . Crying with unclear etiology 11/29/2017  . Screening examination for STD (sexually transmitted disease) 11/29/2017    Past Medical History:  Diagnosis Date  . Anemia    with pregnancy  . Blood transfusion without reported diagnosis 1998, 2000, 2008  . History of gallstones   . PONV (postoperative nausea and vomiting)     Past Surgical History:  Procedure Laterality Date  . CESAREAN SECTION     x3  . CHOLECYSTECTOMY      Current Facility-Administered Medications  Medication Dose Route Frequency Provider Last Rate Last Dose  . lactated ringers infusion   Intravenous Continuous Romualdo BolkJertson, Glendi Mohiuddin Evelyn, MD 100 mL/hr at 01/17/18 [id number]       ALLERGIES: Patient has no known allergies.  Family History  Problem Relation Age of Onset  . Stroke Mother     Social History   Socioeconomic History  . Marital status: Married    Spouse name: Not on file  . Number of children: Not on file  . Years of education: Not on file  . Highest education level: Not on file  Occupational History  . Not on file  Social Needs  . Financial  resource strain: Not on file  . Food insecurity:    Worry: Not on file    Inability: Not on file  . Transportation needs:    Medical: Not on file    Non-medical: Not on file  Tobacco Use  . Smoking status: Former Smoker    Packs/day: 1.00    Years: 19.00    Pack years: 19.00    Types: Cigarettes  . Smokeless tobacco: Never Used  Substance and Sexual Activity  . Alcohol use: No  . Drug use: No  . Sexual activity: Yes    Birth control/protection: IUD    Comment: Mirena  Lifestyle  . Physical activity:    Days per week: Not on file    Minutes per session: Not on file  . Stress: Not on file  Relationships  . Social connections:    Talks on phone: Not on file    Gets together: Not on file    Attends religious service: Not on file    Active member of club or organization: Not on file    Attends meetings of clubs or organizations: Not on  file    Relationship status: Not on file  . Intimate partner violence:    Fear of current or ex partner: Not on file    Emotionally abused: Not on file    Physically abused: Not on file    Forced sexual activity: Not on file  Other Topics Concern  . Not on file  Social History Narrative  . Not on file    Review of Systems  PHYSICAL EXAMINATION:    BP 131/76   Pulse 70   Temp 98.7 F (37.1 C) (Oral)   Resp 16   Ht 5\' 4"  (1.626 m)   Wt 63 kg   SpO2 100%   BMI 23.84 kg/m     General appearance: alert, cooperative and appears stated age Neck: no adenopathy, supple, symmetrical, trachea midline and thyroid normal to inspection and palpation Heart: regular rate and rhythm Lungs: CTAB Abdomen: soft, non-tender; bowel sounds normal; no masses,  no organomegaly Extremities: normal, atraumatic, no cyanosis Skin: normal color, texture and turgor, no rashes or lesions Lymph: normal cervical supraclavicular and inguinal nodes Neurologic: grossly normal     ASSESSMENT Contraception, unable to place the mirena IUD in the office H/O  C/S x 3 and some scarring of the upper cervical canal.     PLAN IUD insertion under ultrasound guidance  Pre-treated with cytotec

## 2018-01-18 ENCOUNTER — Encounter (HOSPITAL_BASED_OUTPATIENT_CLINIC_OR_DEPARTMENT_OTHER): Payer: Self-pay | Admitting: Obstetrics and Gynecology

## 2018-01-19 ENCOUNTER — Encounter: Payer: Self-pay | Admitting: Obstetrics and Gynecology

## 2018-01-19 ENCOUNTER — Telehealth: Payer: Self-pay | Admitting: Emergency Medicine

## 2018-01-19 NOTE — Telephone Encounter (Signed)
RE: Non-Urgent Medical Question 01/19/2018 5:48 PM    To: Ruben ImKesha Vaca    From: Joeseph AmorFast, My Madariaga L, RN    Created: 01/19/2018 5:48 PM     Ms. Dawn Russo,   I attempted to call you on your mobile phone and was unable to reach you. Please call [phone] and speak with the on call provider as soon as possible. Thank you.  Almedia Ballsracy Zniya Cottone, RN    ----- Message -----  From: Ruben ImKesha Russo  Sent: 9/18/20195:26 PM EDT  To: Romualdo BolkJill Evelyn Jertson, MD Subject: Non-Urgent Medical Question  I was wondering if it's ok to have swollen ankles and legs after the procedure that was done Monday?

## 2018-01-20 ENCOUNTER — Telehealth: Payer: Self-pay | Admitting: Obstetrics and Gynecology

## 2018-01-20 NOTE — Progress Notes (Deleted)
GYNECOLOGY  VISIT   HPI: 42 y.o.   Married White or Caucasian Not Hispanic or Latino  female   [phone] with No LMP recorded. (Menstrual status: IUD).   here for   Leg swelling after mirena placement  GYNECOLOGIC HISTORY: No LMP recorded. (Menstrual status: IUD). Contraception: mirena IUD Menopausal hormone therapy: none        OB History    Gravida  4   Para  3   Term  3   Preterm  0   AB  1   Living  3     SAB      TAB      Ectopic      Multiple      Live Births  3              Patient Active Problem List   Diagnosis Date Noted  . Healthcare maintenance 11/29/2017  . Crying with unclear etiology 11/29/2017  . Screening examination for STD (sexually transmitted disease) 11/29/2017    Past Medical History:  Diagnosis Date  . Anemia    with pregnancy  . Blood transfusion without reported diagnosis 1998, 2000, 2008  . History of gallstones   . PONV (postoperative nausea and vomiting)     Past Surgical History:  Procedure Laterality Date  . CESAREAN SECTION     x3  . CHOLECYSTECTOMY    . INTRAUTERINE DEVICE (IUD) INSERTION N/A 01/17/2018   Procedure: INTRAUTERINE DEVICE (IUD) INSERTION with ultrasound guidance;  Surgeon: Romualdo BolkJertson, Jill Evelyn, MD;  [facility];  Service: Gynecology;  Laterality: N/A;  Mirena IUD insertion under ultrasound guidance.    Current Outpatient Medications  Medication Sig Dispense Refill  . ibuprofen (ADVIL,MOTRIN) 600 MG tablet Take 600 mg by mouth every 6 (six) hours as needed.    Marland Kitchen. levonorgestrel (MIRENA, 52 MG,) 20 MCG/24HR IUD Dispense one device for physician insertion in OR. 1 each 0   No current facility-administered medications for this visit.      ALLERGIES: Patient has no known allergies.  Family History  Problem Relation Age of Onset  . Stroke Mother     Social History   Socioeconomic History  . Marital status: Married    Spouse name: Not on file  . Number of children: Not on  file  . Years of education: Not on file  . Highest education level: Not on file  Occupational History  . Not on file  Social Needs  . Financial resource strain: Not on file  . Food insecurity:    Worry: Not on file    Inability: Not on file  . Transportation needs:    Medical: Not on file    Non-medical: Not on file  Tobacco Use  . Smoking status: Former Smoker    Packs/day: 1.00    Years: 19.00    Pack years: 19.00    Types: Cigarettes  . Smokeless tobacco: Never Used  Substance and Sexual Activity  . Alcohol use: No  . Drug use: No  . Sexual activity: Yes    Birth control/protection: IUD    Comment: Mirena  Lifestyle  . Physical activity:    Days per week: Not on file    Minutes per session: Not on file  . Stress: Not on file  Relationships  . Social connections:    Talks on phone: Not on file    Gets together: Not on file    Attends religious service: Not on file  Active member of club or organization: Not on file    Attends meetings of clubs or organizations: Not on file    Relationship status: Not on file  . Intimate partner violence:    Fear of current or ex partner: Not on file    Emotionally abused: Not on file    Physically abused: Not on file    Forced sexual activity: Not on file  Other Topics Concern  . Not on file  Social History Narrative  . Not on file    Review of Systems  Constitutional: Negative.   HENT: Negative.   Eyes: Negative.   Respiratory: Negative.   Cardiovascular: Negative.   Gastrointestinal: Negative.   Endocrine: Negative.   Genitourinary: Negative.   Musculoskeletal: Negative.   Skin: Negative.   Allergic/Immunologic: Negative.   Neurological: Negative.   Hematological: Negative.   Psychiatric/Behavioral: Negative.     PHYSICAL EXAMINATION:    There were no vitals taken for this visit.    General appearance: alert, cooperative and appears stated age Neck: no adenopathy, supple, symmetrical, trachea midline and  thyroid {CHL AMB PHY EX THYROID NORM [phone]::"normal to inspection and palpation"} Breasts: {Exam; breast:13139::"normal appearance, no masses or tenderness"} Abdomen: soft, non-tender; non distended, no masses,  no organomegaly  Pelvic: External genitalia:  no lesions              Urethra:  normal appearing urethra with no masses, tenderness or lesions              Bartholins and Skenes: normal                 Vagina: normal appearing vagina with normal color and discharge, no lesions              Cervix: {CHL AMB PHY EX CERVIX NORM DEFAULT:[phone]::"no lesions"}              Bimanual Exam:  Uterus:  {CHL AMB PHY EX UTERUS NORM DEFAULT:[phone]::"normal size, contour, position, consistency, mobility, non-tender"}              Adnexa: {CHL AMB PHY EX ADNEXA NO MASS DEFAULT:[phone]::"no mass, fullness, tenderness"}              Rectovaginal: {yes no:[id number]}.  Confirms.              Anus:  normal sphincter tone, no lesions  Chaperone was present for exam.  ASSESSMENT     PLAN    An After Visit Summary was printed and given to the patient.  *** minutes face to face time of which over 50% was spent in counseling.

## 2018-01-20 NOTE — Telephone Encounter (Signed)
Conversation: Non-Urgent Medical Question  (Newest Message First)  Me  to Ruben ImLeonard, [place]        01/19/18 5:48 PM  Ms. Darcel BayleyLeonard,   I attempted to call you on your mobile phone and was unable to reach you. Please call [phone] and speak with the on call provider as soon as possible. Thank you.  Almedia Ballsracy Yanis Larin, RN    Last read by Ruben ImKesha Cassaro at 6:10 PM on 01/19/2018.

## 2018-01-20 NOTE — Telephone Encounter (Signed)
Due to a schedule conflict, I called the patient to reschedule her appointment for tomorrow, 01/20/18, with Dr. Oscar LaJertson for: Leg swelling s/p IUD placement. Patient stated she is not having any swelling any more and requested to cancel the appointment. She said she'll call back to reschedule if the symptoms return.

## 2018-01-20 NOTE — Telephone Encounter (Signed)
Patient read mychart encounter but did not contact on call physician last night.  Return call to patient. States she feels that both of her legs and feet are swollen. This has not happened before. Feeling that R leg may be more swollen than L. No injuries. Has been working this week. No redness in either leg. No SOB, no CP.   Advised patient that leg swelling is not a normal side effect we would expect to see after IUD insertion. Reviewed OP notes, pt only received 400 ml of IV fluids in OR.   Reviewed with patient possibility of blood clot risks after procedure. Advised needs evaluation today to rule out DVT.   Pt declines office visit today, she is walking into work right now.   Discussed again risk of DVT and potential for PE. Pt verbalizes understanding of risk, however, cannot take time off of work.   Advised patient that it is very important to seek emergency care and to not delay evaluation  and/or call 911 if develops any increase of symptoms, develops leg redness, develops SOB, and or chest pain.   Pt verbalizes understanding of instructions and emergency symptoms.  Routing to Dr. Oscar LaJertson. Will close encounter.

## 2018-01-21 ENCOUNTER — Ambulatory Visit: Payer: Self-pay | Admitting: Obstetrics and Gynecology

## 2018-01-31 NOTE — Progress Notes (Signed)
Subjective:    Patient ID: Dawn Russo, female    DOB: 02-Dec-1975, 42 y.o.   MRN: [id number]  HPI: 11/29/17 OV:  Ms. Husband is here to establish as a new pt.  She is a pleasant 42 year old female.  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 HAs will last for several hours with visual disturbance, "bright spots" She also reports sensitivity to light/sonds, and nausea without vomiting during HA She treats with OTC Ibuprofen 800mg  1-2 times daily She denies being formally dx'd with migraines She also reports crying several times week, can last 90mins-several hours She estimates that this started >2 years ago She denies hx of depression or PTSD She has never seen mental health provider She has never been on antidepressants  She has been married to current husband > 13 years, and they share one son- 27 year old She also has 11 and 63 year old children from previous relationship She has three step-children ages 64, 62, 28 The 75 year old step son lost his daughter 3 years ago She reports excellent relationship with her children, however her husband continues to avoid participating in the family and spends "most of his time in the bedroom when he is not working" She reports this behavior steadily worsening > 8 years She denies hx of infidelity in the past, but is concerned that he has been unfaithful, stating "he has been working longer hours at work" Her husband works at [employer] center and has a sig amount of work stress She reports low libido She denies tobacco/ETOH use She runs 2-3 miles and weight trains 5-6 days/week She follows a heart healthy diet, drinks >gallon water/day and reports excellent sleep She works as a [employer] loss adjuster   02/01/18 OV: Dawn Russo is here for CPE She reports marriage is much more stable and her overall mood/emotional state is "in a much better place". She declined CBT referral. She continues to drink >gallon/water a day, follows  heart healthy diet and abstains from tobacco/ETOH She reports excellent sleep and denies acute complaints today 01/17/18- She had mirena IUD insertion under ultrasound with Dr. Oscar La, she has f/u n few weeks  Healthcare Maintenance: PAP-UTD, 12/07/17, normal Mammogram-N/A Immunizations- UTD  Depression screen [facility] 2/9 02/01/2018  Decreased Interest 0  Down, Depressed, Hopeless 0  PHQ - 2 Score 0  Altered sleeping 0  Tired, decreased energy 0  Change in appetite 0  Feeling bad or failure about yourself  0  Trouble concentrating 0  Moving slowly or fidgety/restless 0  Suicidal thoughts 0  PHQ-9 Score 0  Difficult doing work/chores -    Patient Care Team    Relationship Specialty Notifications Start End  Danford, Jinny Blossom, NP PCP - General Family Medicine  10/25/17     Patient Active Problem List   Diagnosis Date Noted  . Healthcare maintenance 11/29/2017  . Crying with unclear etiology 11/29/2017  . Screening examination for STD (sexually transmitted disease) 11/29/2017     Past Medical History:  Diagnosis Date  . Anemia    with pregnancy  . Blood transfusion without reported diagnosis 1998, 2000, 2008  . History of gallstones   . PONV (postoperative nausea and vomiting)      Past Surgical History:  Procedure Laterality Date  . CESAREAN SECTION     x3  . CHOLECYSTECTOMY    . INTRAUTERINE DEVICE (IUD) INSERTION N/A 01/17/2018   Procedure: INTRAUTERINE DEVICE (IUD) INSERTION with ultrasound guidance;  Surgeon: Romualdo Bolk, MD;  Location: [facility];  Service: Gynecology;  Laterality: N/A;  Mirena IUD insertion under ultrasound guidance.     Family History  Problem Relation Age of Onset  . Stroke Mother      Social History   Substance and Sexual Activity  Drug Use No     Social History   Substance and Sexual Activity  Alcohol Use No     Social History   Tobacco Use  Smoking Status Former Smoker  . Packs/day: 1.00  . Years:  19.00  . Pack years: 19.00  . Types: Cigarettes  Smokeless Tobacco Never Used     Outpatient Encounter Medications as of 02/01/2018  Medication Sig  . ibuprofen (ADVIL,MOTRIN) 600 MG tablet Take 600 mg by mouth every 6 (six) hours as needed.  Marland Kitchen levonorgestrel (MIRENA, 52 MG,) 20 MCG/24HR IUD Dispense one device for physician insertion in OR.   No facility-administered encounter medications on file as of 02/01/2018.     Allergies: Patient has no known allergies.  Body mass index is 24.01 kg/m.  Blood pressure 109/76, pulse 83, height 5\' 4"  (1.626 m), weight 139 lb 14.4 oz (63.5 kg), SpO2 100 %.  Review of Systems  Constitutional: Positive for fatigue. Negative for activity change, appetite change, chills, diaphoresis, fever and unexpected weight change.  HENT: Negative for congestion.   Eyes: Negative for visual disturbance.  Respiratory: Negative for cough, chest tightness, shortness of breath, wheezing and stridor.   Cardiovascular: Negative for chest pain, palpitations and leg swelling.  Gastrointestinal: Negative for abdominal distention, abdominal pain, blood in stool, constipation, diarrhea, nausea and vomiting.  Endocrine: Negative for cold intolerance, heat intolerance, polydipsia, polyphagia and polyuria.  Genitourinary: Negative for difficulty urinating and flank pain.  Musculoskeletal: Negative for arthralgias, back pain, gait problem, joint swelling, myalgias, neck pain and neck stiffness.  Skin: Negative for color change, pallor, rash and wound.  Neurological: Negative for dizziness, weakness and headaches.  Hematological: Does not bruise/bleed easily.  Psychiatric/Behavioral: Negative for behavioral problems, confusion, decreased concentration, dysphoric mood, hallucinations, self-injury, sleep disturbance and suicidal ideas. The patient is not nervous/anxious and is not hyperactive.        Objective:   Physical Exam  Constitutional: She is oriented to person,  place, and time. She appears well-developed and well-nourished. No distress.  HENT:  Head: Normocephalic and atraumatic.  Right Ear: External ear normal.  Left Ear: External ear normal.  Nose: Nose normal.  Mouth/Throat: Oropharynx is clear and moist.  Cardiovascular: Normal rate, regular rhythm, normal heart sounds and intact distal pulses.  No murmur heard. Pulmonary/Chest: Effort normal and breath sounds normal. No stridor. No respiratory distress. She has no wheezes. She has no rales. She exhibits no tenderness.  Neurological: She is alert and oriented to person, place, and time.  Skin: Skin is warm and dry. Capillary refill takes less than 2 seconds. No rash noted. She is not diaphoretic. No erythema. No pallor.  Psychiatric: Her speech is normal and behavior is normal. Judgment and thought content normal. Her affect is blunt. Cognition and memory are normal.      Assessment & Plan:   1. Healthcare maintenance   2. Annual physical exam   3. Encounter for vitamin deficiency screening   4. Screening for diabetes mellitus (DM)   5. Screening for thyroid disorder   6. Screening cholesterol level   7. Screening, anemia, deficiency, iron     Healthcare maintenance Marriage is much more stable, she  declined CBT referral Continue to drink plenty of water and follow Mediterranean diet. We will call you when lab results are available. Overall you are doing a great job taking care of yourself! Recommend annual physical with fasting labs.    FOLLOW-UP:  Return in about 1 year (around 02/02/2019).

## 2018-02-01 ENCOUNTER — Ambulatory Visit (INDEPENDENT_AMBULATORY_CARE_PROVIDER_SITE_OTHER): Payer: BC Managed Care – PPO | Admitting: Adult Health

## 2018-02-01 ENCOUNTER — Encounter: Payer: Self-pay | Admitting: Adult Health

## 2018-02-01 DIAGNOSIS — Z130 Encounter for screening for diseases of the blood and blood-forming organs and certain disorders involving the immune mechanism: Secondary | ICD-10-CM

## 2018-02-01 DIAGNOSIS — Z0000 Encounter for general adult medical examination without abnormal findings: Secondary | ICD-10-CM

## 2018-02-01 DIAGNOSIS — Z1321 Encounter for screening for nutritional disorder: Secondary | ICD-10-CM

## 2018-02-01 DIAGNOSIS — Z1329 Encounter for screening for other suspected endocrine disorder: Secondary | ICD-10-CM

## 2018-02-01 DIAGNOSIS — Z131 Encounter for screening for diabetes mellitus: Secondary | ICD-10-CM

## 2018-02-01 DIAGNOSIS — Z13220 Encounter for screening for lipoid disorders: Secondary | ICD-10-CM

## 2018-02-01 NOTE — Patient Instructions (Addendum)
Preventive Care for Adults, Female  A healthy lifestyle and preventive care can promote health and wellness. Preventive health guidelines for women include the following key practices.   A routine yearly physical is a good way to check with your health care provider about your health and preventive screening. It is a chance to share any concerns and updates on your health and to receive a thorough exam.   Visit your dentist for a routine exam and preventive care every 6 months. Brush your teeth twice a day and floss once a day. Good oral hygiene prevents tooth decay and gum disease.   The frequency of eye exams is based on your age, health, family medical history, use of contact lenses, and other factors. Follow your health care provider's recommendations for frequency of eye exams.   Eat a healthy diet. Foods like vegetables, fruits, whole grains, low-fat dairy products, and lean protein foods contain the nutrients you need without too many calories. Decrease your intake of foods high in solid fats, added sugars, and salt. Eat the right amount of calories for you.Get information about a proper diet from your health care provider, if necessary.   Regular physical exercise is one of the most important things you can do for your health. Most adults should get at least 150 minutes of moderate-intensity exercise (any activity that increases your heart rate and causes you to sweat) each week. In addition, most adults need muscle-strengthening exercises on 2 or more days a week.   Maintain a healthy weight. The body mass index (BMI) is a screening tool to identify possible weight problems. It provides an estimate of body fat based on height and weight. Your health care provider can find your BMI, and can help you achieve or maintain a healthy weight.For adults 20 years and older:   - A BMI below 18.5 is considered underweight.   - A BMI of 18.5 to 24.9 is normal.   - A BMI of 25 to 29.9 is  considered overweight.   - A BMI of 30 and above is considered obese.   Maintain normal blood lipids and cholesterol levels by exercising and minimizing your intake of trans and saturated fats.  Eat a balanced diet with plenty of fruit and vegetables. Blood tests for lipids and cholesterol should begin at age 20 and be repeated every 5 years minimum.  If your lipid or cholesterol levels are high, you are over 40, or you are at high risk for heart disease, you may need your cholesterol levels checked more frequently.Ongoing high lipid and cholesterol levels should be treated with medicines if diet and exercise are not working.   If you smoke, find out from your health care provider how to quit. If you do not use tobacco, do not start.   Lung cancer screening is recommended for adults aged 55-80 years who are at high risk for developing lung cancer because of a history of smoking. A yearly low-dose CT scan of the lungs is recommended for people who have at least a 30-pack-year history of smoking and are a current smoker or have quit within the past 15 years. A pack year of smoking is smoking an average of 1 pack of cigarettes a day for 1 year (for example: 1 pack a day for 30 years or 2 packs a day for 15 years). Yearly screening should continue until the smoker has stopped smoking for at least 15 years. Yearly screening should be stopped for people who develop a   health problem that would prevent them from having lung cancer treatment.   If you are pregnant, do not drink alcohol. If you are breastfeeding, be very cautious about drinking alcohol. If you are not pregnant and choose to drink alcohol, do not have more than 1 drink per day. One drink is considered to be 12 ounces (355 mL) of beer, 5 ounces (148 mL) of wine, or 1.5 ounces (44 mL) of liquor.   Avoid use of street drugs. Do not share needles with anyone. Ask for help if you need support or instructions about stopping the use of  drugs.   High blood pressure causes heart disease and increases the risk of stroke. Your blood pressure should be checked at least yearly.  Ongoing high blood pressure should be treated with medicines if weight loss and exercise do not work.   If you are 69-55 years old, ask your health care provider if you should take aspirin to prevent strokes.   Diabetes screening involves taking a blood sample to check your fasting blood sugar level. This should be done once every 3 years, after age 38, if you are within normal weight and without risk factors for diabetes. Testing should be considered at a younger age or be carried out more frequently if you are overweight and have at least 1 risk factor for diabetes.   Breast cancer screening is essential preventive care for women. You should practice "breast self-awareness."  This means understanding the normal appearance and feel of your breasts and may include breast self-examination.  Any changes detected, no matter how small, should be reported to a health care provider.  Women in their 80s and 30s should have a clinical breast exam (CBE) by a health care provider as part of a regular health exam every 1 to 3 years.  After age 66, women should have a CBE every year.  Starting at age 1, women should consider having a mammogram (breast X-ray test) every year.  Women who have a family history of breast cancer should talk to their health care provider about genetic screening.  Women at a high risk of breast cancer should talk to their health care providers about having an MRI and a mammogram every year.   -Breast cancer gene (BRCA)-related cancer risk assessment is recommended for women who have family members with BRCA-related cancers. BRCA-related cancers include breast, ovarian, tubal, and peritoneal cancers. Having family members with these cancers may be associated with an increased risk for harmful changes (mutations) in the breast cancer genes BRCA1 and  BRCA2. Results of the assessment will determine the need for genetic counseling and BRCA1 and BRCA2 testing.   The Pap test is a screening test for cervical cancer. A Pap test can show cell changes on the cervix that might become cervical cancer if left untreated. A Pap test is a procedure in which cells are obtained and examined from the lower end of the uterus (cervix).   - Women should have a Pap test starting at age 57.   - Between ages 90 and 70, Pap tests should be repeated every 2 years.   - Beginning at age 63, you should have a Pap test every 3 years as long as the past 3 Pap tests have been normal.   - Some women have medical problems that increase the chance of getting cervical cancer. Talk to your health care provider about these problems. It is especially important to talk to your health care provider if a  new problem develops soon after your last Pap test. In these cases, your health care provider may recommend more frequent screening and Pap tests.   - The above recommendations are the same for women who have or have not gotten the vaccine for human papillomavirus (HPV).   - If you had a hysterectomy for a problem that was not cancer or a condition that could lead to cancer, then you no longer need Pap tests. Even if you no longer need a Pap test, a regular exam is a good idea to make sure no other problems are starting.   - If you are between ages 36 and 66 years, and you have had normal Pap tests going back 10 years, you no longer need Pap tests. Even if you no longer need a Pap test, a regular exam is a good idea to make sure no other problems are starting.   - If you have had past treatment for cervical cancer or a condition that could lead to cancer, you need Pap tests and screening for cancer for at least 20 years after your treatment.   - If Pap tests have been discontinued, risk factors (such as a new sexual partner) need to be reassessed to determine if screening should  be resumed.   - The HPV test is an additional test that may be used for cervical cancer screening. The HPV test looks for the virus that can cause the cell changes on the cervix. The cells collected during the Pap test can be tested for HPV. The HPV test could be used to screen women aged 70 years and older, and should be used in women of any age who have unclear Pap test results. After the age of 67, women should have HPV testing at the same frequency as a Pap test.   Colorectal cancer can be detected and often prevented. Most routine colorectal cancer screening begins at the age of 57 years and continues through age 26 years. However, your health care provider may recommend screening at an earlier age if you have risk factors for colon cancer. On a yearly basis, your health care provider may provide home test kits to check for hidden blood in the stool.  Use of a small camera at the end of a tube, to directly examine the colon (sigmoidoscopy or colonoscopy), can detect the earliest forms of colorectal cancer. Talk to your health care provider about this at age 23, when routine screening begins. Direct exam of the colon should be repeated every 5 -10 years through age 49 years, unless early forms of pre-cancerous polyps or small growths are found.   People who are at an increased risk for hepatitis B should be screened for this virus. You are considered at high risk for hepatitis B if:  -You were born in a country where hepatitis B occurs often. Talk with your health care provider about which countries are considered high risk.  - Your parents were born in a high-risk country and you have not received a shot to protect against hepatitis B (hepatitis B vaccine).  - You have HIV or AIDS.  - You use needles to inject street drugs.  - You live with, or have sex with, someone who has Hepatitis B.  - You get hemodialysis treatment.  - You take certain medicines for conditions like cancer, organ  transplantation, and autoimmune conditions.   Hepatitis C blood testing is recommended for all people born from [birth year] through 1965 and any individual  with known risks for hepatitis C.   Practice safe sex. Use condoms and avoid high-risk sexual practices to reduce the spread of sexually transmitted infections (STIs). STIs include gonorrhea, chlamydia, syphilis, trichomonas, herpes, HPV, and human immunodeficiency virus (HIV). Herpes, HIV, and HPV are viral illnesses that have no cure. They can result in disability, cancer, and death. Sexually active women aged 25 years and younger should be checked for chlamydia. Older women with new or multiple partners should also be tested for chlamydia. Testing for other STIs is recommended if you are sexually active and at increased risk.   Osteoporosis is a disease in which the bones lose minerals and strength with aging. This can result in serious bone fractures or breaks. The risk of osteoporosis can be identified using a bone density scan. Women ages 65 years and over and women at risk for fractures or osteoporosis should discuss screening with their health care providers. Ask your health care provider whether you should take a calcium supplement or vitamin D to There are also several preventive steps women can take to avoid osteoporosis and resulting fractures or to keep osteoporosis from worsening. -->Recommendations include:  Eat a balanced diet high in fruits, vegetables, calcium, and vitamins.  Get enough calcium. The recommended total intake of is 1,200 mg daily; for best absorption, if taking supplements, divide doses into 250-500 mg doses throughout the day. Of the two types of calcium, calcium carbonate is best absorbed when taken with food but calcium citrate can be taken on an empty stomach.  Get enough vitamin D. NAMS and the National Osteoporosis Foundation recommend at least 1,000 IU per day for women age 50 and over who are at risk of vitamin D  deficiency. Vitamin D deficiency can be caused by inadequate sun exposure (for example, those who live in northern latitudes).  Avoid alcohol and smoking. Heavy alcohol intake (more than 7 drinks per week) increases the risk of falls and hip fracture and women smokers tend to lose bone more rapidly and have lower bone mass than nonsmokers. Stopping smoking is one of the most important changes women can make to improve their health and decrease risk for disease.  Be physically active every day. Weight-bearing exercise (for example, fast walking, hiking, jogging, and weight training) may strengthen bones or slow the rate of bone loss that comes with aging. Balancing and muscle-strengthening exercises can reduce the risk of falling and fracture.  Consider therapeutic medications. Currently, several types of effective drugs are available. Healthcare providers can recommend the type most appropriate for each woman.  Eliminate environmental factors that may contribute to accidents. Falls cause nearly 90% of all osteoporotic fractures, so reducing this risk is an important bone-health strategy. Measures include ample lighting, removing obstructions to walking, using nonskid rugs on floors, and placing mats and/or grab bars in showers.  Be aware of medication side effects. Some common medicines make bones weaker. These include a type of steroid drug called glucocorticoids used for arthritis and asthma, some antiseizure drugs, certain sleeping pills, treatments for endometriosis, and some cancer drugs. An overactive thyroid gland or using too much thyroid hormone for an underactive thyroid can also be a problem. If you are taking these medicines, talk to your doctor about what you can do to help protect your bones.reduce the rate of osteoporosis.    Menopause can be associated with physical symptoms and risks. Hormone replacement therapy is available to decrease symptoms and risks. You should talk to your  health care provider   about whether hormone replacement therapy is right for you.   Use sunscreen. Apply sunscreen liberally and repeatedly throughout the day. You should seek shade when your shadow is shorter than you. Protect yourself by wearing long sleeves, pants, a wide-brimmed hat, and sunglasses year round, whenever you are outdoors.   Once a month, do a whole body skin exam, using a mirror to look at the skin on your back. Tell your health care provider of new moles, moles that have irregular borders, moles that are larger than a pencil eraser, or moles that have changed in shape or color.   -Stay current with required vaccines (immunizations).   Influenza vaccine. All adults should be immunized every year.  Tetanus, diphtheria, and acellular pertussis (Td, Tdap) vaccine. Pregnant women should receive 1 dose of Tdap vaccine during each pregnancy. The dose should be obtained regardless of the length of time since the last dose. Immunization is preferred during the 27th 36th week of gestation. An adult who has not previously received Tdap or who does not know her vaccine status should receive 1 dose of Tdap. This initial dose should be followed by tetanus and diphtheria toxoids (Td) booster doses every 10 years. Adults with an unknown or incomplete history of completing a 3-dose immunization series with Td-containing vaccines should begin or complete a primary immunization series including a Tdap dose. Adults should receive a Td booster every 10 years.  Varicella vaccine. An adult without evidence of immunity to varicella should receive 2 doses or a second dose if she has previously received 1 dose. Pregnant females who do not have evidence of immunity should receive the first dose after pregnancy. This first dose should be obtained before leaving the health care facility. The second dose should be obtained 4 8 weeks after the first dose.  Human papillomavirus (HPV) vaccine. Females aged 13 26  years who have not received the vaccine previously should obtain the 3-dose series. The vaccine is not recommended for use in pregnant females. However, pregnancy testing is not needed before receiving a dose. If a female is found to be pregnant after receiving a dose, no treatment is needed. In that case, the remaining doses should be delayed until after the pregnancy. Immunization is recommended for any person with an immunocompromised condition through the age of 26 years if she did not get any or all doses earlier. During the 3-dose series, the second dose should be obtained 4 8 weeks after the first dose. The third dose should be obtained 24 weeks after the first dose and 16 weeks after the second dose.  Zoster vaccine. One dose is recommended for adults aged 60 years or older unless certain conditions are present.  Measles, mumps, and rubella (MMR) vaccine. Adults born before 1957 generally are considered immune to measles and mumps. Adults born in 1957 or later should have 1 or more doses of MMR vaccine unless there is a contraindication to the vaccine or there is laboratory evidence of immunity to each of the three diseases. A routine second dose of MMR vaccine should be obtained at least 28 days after the first dose for students attending postsecondary schools, health care workers, or international travelers. People who received inactivated measles vaccine or an unknown type of measles vaccine during 1963 1967 should receive 2 doses of MMR vaccine. People who received inactivated mumps vaccine or an unknown type of mumps vaccine before 1979 and are at high risk for mumps infection should consider immunization with 2 doses of   MMR vaccine. For females of childbearing age, rubella immunity should be determined. If there is no evidence of immunity, females who are not pregnant should be vaccinated. If there is no evidence of immunity, females who are pregnant should delay immunization until after pregnancy.  Unvaccinated health care workers born before [birth year] who lack laboratory evidence of measles, mumps, or rubella immunity or laboratory confirmation of disease should consider measles and mumps immunization with 2 doses of MMR vaccine or rubella immunization with 1 dose of MMR vaccine.  Pneumococcal 13-valent conjugate (PCV13) vaccine. When indicated, a person who is uncertain of her immunization history and has no record of immunization should receive the PCV13 vaccine. An adult aged 54 years or older who has certain medical conditions and has not been previously immunized should receive 1 dose of PCV13 vaccine. This PCV13 should be followed with a dose of pneumococcal polysaccharide (PPSV23) vaccine. The PPSV23 vaccine dose should be obtained at least 8 weeks after the dose of PCV13 vaccine. An adult aged 58 years or older who has certain medical conditions and previously received 1 or more doses of PPSV23 vaccine should receive 1 dose of PCV13. The PCV13 vaccine dose should be obtained 1 or more years after the last PPSV23 vaccine dose.  Pneumococcal polysaccharide (PPSV23) vaccine. When PCV13 is also indicated, PCV13 should be obtained first. All adults aged 58 years and older should be immunized. An adult younger than age 65 years who has certain medical conditions should be immunized. Any person who resides in a nursing home or long-term care facility should be immunized. An adult smoker should be immunized. People with an immunocompromised condition and certain other conditions should receive both PCV13 and PPSV23 vaccines. People with human immunodeficiency virus (HIV) infection should be immunized as soon as possible after diagnosis. Immunization during chemotherapy or radiation therapy should be avoided. Routine use of PPSV23 vaccine is not recommended for American Indians, [place] Natives, or people younger than 65 years unless there are medical conditions that require PPSV23 vaccine. When indicated,  people who have unknown immunization and have no record of immunization should receive PPSV23 vaccine. One-time revaccination 5 years after the first dose of PPSV23 is recommended for people aged 70 64 years who have chronic kidney failure, nephrotic syndrome, asplenia, or immunocompromised conditions. People who received 1 2 doses of PPSV23 before age 32 years should receive another dose of PPSV23 vaccine at age 96 years or later if at least 5 years have passed since the previous dose. Doses of PPSV23 are not needed for people immunized with PPSV23 at or after age 55 years.  Meningococcal vaccine. Adults with asplenia or persistent complement component deficiencies should receive 2 doses of quadrivalent meningococcal conjugate (MenACWY-D) vaccine. The doses should be obtained at least 2 months apart. Microbiologists working with certain meningococcal bacteria, [employer] recruits, people at risk during an outbreak, and people who travel to or live in countries with a high rate of meningitis should be immunized. A first-year college student up through age 58 years who is living in a residence hall should receive a dose if she did not receive a dose on or after her 16th birthday. Adults who have certain high-risk conditions should receive one or more doses of vaccine.  Hepatitis A vaccine. Adults who wish to be protected from this disease, have certain high-risk conditions, work with hepatitis A-infected animals, work in hepatitis A research labs, or travel to or work in countries with a high rate of hepatitis A should be  immunized. Adults who were previously unvaccinated and who anticipate close contact with an international adoptee during the first 60 days after arrival in the [place] from a country with a high rate of hepatitis A should be immunized.  Hepatitis B vaccine.  Adults who wish to be protected from this disease, have certain high-risk conditions, may be exposed to blood or other infectious  body fluids, are household contacts or sex partners of hepatitis B positive people, are clients or workers in certain care facilities, or travel to or work in countries with a high rate of hepatitis B should be immunized.  Haemophilus influenzae type b (Hib) vaccine. A previously unvaccinated person with asplenia or sickle cell disease or having a scheduled splenectomy should receive 1 dose of Hib vaccine. Regardless of previous immunization, a recipient of a hematopoietic stem cell transplant should receive a 3-dose series 6 12 months after her successful transplant. Hib vaccine is not recommended for adults with HIV infection.  Preventive Services / Frequency Ages 6 to 39years  Blood pressure check.** / Every 1 to 2 years.  Lipid and cholesterol check.** / Every 5 years beginning at age 39.  Clinical breast exam.** / Every 3 years for women in their 61s and 62s.  BRCA-related cancer risk assessment.** / For women who have family members with a BRCA-related cancer (breast, ovarian, tubal, or peritoneal cancers).  Pap test.** / Every 2 years from ages 47 through 85. Every 3 years starting at age 34 through age 12 or 74 with a history of 3 consecutive normal Pap tests.  HPV screening.** / Every 3 years from ages 46 through ages 43 to 54 with a history of 3 consecutive normal Pap tests.  Hepatitis C blood test.** / For any individual with known risks for hepatitis C.  Skin self-exam. / Monthly.  Influenza vaccine. / Every year.  Tetanus, diphtheria, and acellular pertussis (Tdap, Td) vaccine.** / Consult your health care provider. Pregnant women should receive 1 dose of Tdap vaccine during each pregnancy. 1 dose of Td every 10 years.  Varicella vaccine.** / Consult your health care provider. Pregnant females who do not have evidence of immunity should receive the first dose after pregnancy.  HPV vaccine. / 3 doses over 6 months, if 64 and younger. The vaccine is not recommended for use in  pregnant females. However, pregnancy testing is not needed before receiving a dose.  Measles, mumps, rubella (MMR) vaccine.** / You need at least 1 dose of MMR if you were born in 1957 or later. You may also need a 2nd dose. For females of childbearing age, rubella immunity should be determined. If there is no evidence of immunity, females who are not pregnant should be vaccinated. If there is no evidence of immunity, females who are pregnant should delay immunization until after pregnancy.  Pneumococcal 13-valent conjugate (PCV13) vaccine.** / Consult your health care provider.  Pneumococcal polysaccharide (PPSV23) vaccine.** / 1 to 2 doses if you smoke cigarettes or if you have certain conditions.  Meningococcal vaccine.** / 1 dose if you are age 71 to 37 years and a Market researcher living in a residence hall, or have one of several medical conditions, you need to get vaccinated against meningococcal disease. You may also need additional booster doses.  Hepatitis A vaccine.** / Consult your health care provider.  Hepatitis B vaccine.** / Consult your health care provider.  Haemophilus influenzae type b (Hib) vaccine.** / Consult your health care provider.  Ages 55 to 64years  Blood pressure check.** / Every 1 to 2 years.  Lipid and cholesterol check.** / Every 5 years beginning at age 20 years.  Lung cancer screening. / Every year if you are aged 55 80 years and have a 30-pack-year history of smoking and currently smoke or have quit within the past 15 years. Yearly screening is stopped once you have quit smoking for at least 15 years or develop a health problem that would prevent you from having lung cancer treatment.  Clinical breast exam.** / Every year after age 40 years.  BRCA-related cancer risk assessment.** / For women who have family members with a BRCA-related cancer (breast, ovarian, tubal, or peritoneal cancers).  Mammogram.** / Every year beginning at age 40  years and continuing for as long as you are in good health. Consult with your health care provider.  Pap test.** / Every 3 years starting at age 30 years through age 65 or 70 years with a history of 3 consecutive normal Pap tests.  HPV screening.** / Every 3 years from ages 30 years through ages 65 to 70 years with a history of 3 consecutive normal Pap tests.  Fecal occult blood test (FOBT) of stool. / Every year beginning at age 50 years and continuing until age 75 years. You may not need to do this test if you get a colonoscopy every 10 years.  Flexible sigmoidoscopy or colonoscopy.** / Every 5 years for a flexible sigmoidoscopy or every 10 years for a colonoscopy beginning at age 50 years and continuing until age 75 years.  Hepatitis C blood test.** / For all people born from 1945 through 1965 and any individual with known risks for hepatitis C.  Skin self-exam. / Monthly.  Influenza vaccine. / Every year.  Tetanus, diphtheria, and acellular pertussis (Tdap/Td) vaccine.** / Consult your health care provider. Pregnant women should receive 1 dose of Tdap vaccine during each pregnancy. 1 dose of Td every 10 years.  Varicella vaccine.** / Consult your health care provider. Pregnant females who do not have evidence of immunity should receive the first dose after pregnancy.  Zoster vaccine.** / 1 dose for adults aged 60 years or older.  Measles, mumps, rubella (MMR) vaccine.** / You need at least 1 dose of MMR if you were born in 1957 or later. You may also need a 2nd dose. For females of childbearing age, rubella immunity should be determined. If there is no evidence of immunity, females who are not pregnant should be vaccinated. If there is no evidence of immunity, females who are pregnant should delay immunization until after pregnancy.  Pneumococcal 13-valent conjugate (PCV13) vaccine.** / Consult your health care provider.  Pneumococcal polysaccharide (PPSV23) vaccine.** / 1 to 2 doses if  you smoke cigarettes or if you have certain conditions.  Meningococcal vaccine.** / Consult your health care provider.  Hepatitis A vaccine.** / Consult your health care provider.  Hepatitis B vaccine.** / Consult your health care provider.  Haemophilus influenzae type b (Hib) vaccine.** / Consult your health care provider.  Ages 65 years and over  Blood pressure check.** / Every 1 to 2 years.  Lipid and cholesterol check.** / Every 5 years beginning at age 20 years.  Lung cancer screening. / Every year if you are aged 55 80 years and have a 30-pack-year history of smoking and currently smoke or have quit within the past 15 years. Yearly screening is stopped once you have quit smoking for at least 15 years or develop a health problem that   would prevent you from having lung cancer treatment.  Clinical breast exam.** / Every year after age 103 years.  BRCA-related cancer risk assessment.** / For women who have family members with a BRCA-related cancer (breast, ovarian, tubal, or peritoneal cancers).  Mammogram.** / Every year beginning at age 36 years and continuing for as long as you are in good health. Consult with your health care provider.  Pap test.** / Every 3 years starting at age 5 years through age 85 or 10 years with 3 consecutive normal Pap tests. Testing can be stopped between 65 and 70 years with 3 consecutive normal Pap tests and no abnormal Pap or HPV tests in the past 10 years.  HPV screening.** / Every 3 years from ages 93 years through ages 70 or 45 years with a history of 3 consecutive normal Pap tests. Testing can be stopped between 65 and 70 years with 3 consecutive normal Pap tests and no abnormal Pap or HPV tests in the past 10 years.  Fecal occult blood test (FOBT) of stool. / Every year beginning at age 8 years and continuing until age 45 years. You may not need to do this test if you get a colonoscopy every 10 years.  Flexible sigmoidoscopy or colonoscopy.** /  Every 5 years for a flexible sigmoidoscopy or every 10 years for a colonoscopy beginning at age 69 years and continuing until age 68 years.  Hepatitis C blood test.** / For all people born from [birth year] through 1963 and any individual with known risks for hepatitis C.  Osteoporosis screening.** / A one-time screening for women ages 7 years and over and women at risk for fractures or osteoporosis.  Skin self-exam. / Monthly.  Influenza vaccine. / Every year.  Tetanus, diphtheria, and acellular pertussis (Tdap/Td) vaccine.** / 1 dose of Td every 10 years.  Varicella vaccine.** / Consult your health care provider.  Zoster vaccine.** / 1 dose for adults aged 5 years or older.  Pneumococcal 13-valent conjugate (PCV13) vaccine.** / Consult your health care provider.  Pneumococcal polysaccharide (PPSV23) vaccine.** / 1 dose for all adults aged 74 years and older.  Meningococcal vaccine.** / Consult your health care provider.  Hepatitis A vaccine.** / Consult your health care provider.  Hepatitis B vaccine.** / Consult your health care provider.  Haemophilus influenzae type b (Hib) vaccine.** / Consult your health care provider. ** Family history and personal history of risk and conditions may change your health care provider's recommendations. Document Released: 06/16/2001 Document Revised: 02/08/2013  [facility] Patient Information 2014 [place], [place].   EXERCISE AND DIET:  We recommended that you start or continue a regular exercise program for good health. Regular exercise means any activity that makes your heart beat faster and makes you sweat.  We recommend exercising at least 30 minutes per day at least 3 days a week, preferably 5.  We also recommend a diet low in fat and sugar / carbohydrates.  Inactivity, poor dietary choices and obesity can cause diabetes, heart attack, stroke, and kidney damage, among others.     ALCOHOL AND SMOKING:  Women should limit their alcohol intake to no  more than 7 drinks/beers/glasses of wine (combined, not each!) per week. Moderation of alcohol intake to this level decreases your risk of breast cancer and liver damage.  ( And of course, no recreational drugs are part of a healthy lifestyle.)  Also, you should not be smoking at all or even being exposed to second hand smoke. Most people know smoking can  cause cancer, and various heart and lung diseases, but did you know it also contributes to weakening of your bones?  Aging of your skin?  Yellowing of your teeth and nails?   CALCIUM AND VITAMIN D:  Adequate intake of calcium and Vitamin D are recommended.  The recommendations for exact amounts of these supplements seem to change often, but generally speaking 600 mg of calcium (either carbonate or citrate) and 800 units of Vitamin D per day seems prudent. Certain women may benefit from higher intake of Vitamin D.  If you are among these women, your doctor will have told you during your visit.     PAP SMEARS:  Pap smears, to check for cervical cancer or precancers,  have traditionally been done yearly, although recent scientific advances have shown that most women can have pap smears less often.  However, every woman still should have a physical exam from her gynecologist or primary care physician every year. It will include a breast check, inspection of the vulva and vagina to check for abnormal growths or skin changes, a visual exam of the cervix, and then an exam to evaluate the size and shape of the uterus and ovaries.  And after 40 years of age, a rectal exam is indicated to check for rectal cancers. We will also provide age appropriate advice regarding health maintenance, like when you should have certain vaccines, screening for sexually transmitted diseases, bone density testing, colonoscopy, mammograms, etc.    MAMMOGRAMS:  All women over 61 years old should have a yearly mammogram. Many facilities now offer a "3D" mammogram, which may cost  around $50 extra out of pocket. If possible,  we recommend you accept the option to have the 3D mammogram performed.  It both reduces the number of women who will be called back for extra views which then turn out to be normal, and it is better than the routine mammogram at detecting truly abnormal areas.     COLONOSCOPY:  Colonoscopy to screen for colon cancer is recommended for all women at age 40.  We know, you hate the idea of the prep.  We agree, BUT, having colon cancer and not knowing it is worse!!  Colon cancer so often starts as a polyp that can be seen and removed at colonscopy, which can quite literally save your life!  And if your first colonoscopy is normal and you have no family history of colon cancer, most women don't have to have it again for 10 years.  Once every ten years, you can do something that may end up saving your life, right?  We will be happy to help you get it scheduled when you are ready.  Be sure to check your insurance coverage so you understand how much it will cost.  It may be covered as a preventative service at no cost, but you should check your particular policy.    Continue to drink plenty of water and follow Mediterranean diet. We will call you when lab results are available. Overall you are doing a great job taking care of yourself! Recommend annual physical with fasting labs. NICE TO SEE YOU!

## 2018-02-01 NOTE — Assessment & Plan Note (Signed)
Marriage is much more stable, she declined CBT referral Continue to drink plenty of water and follow Mediterranean diet. We will call you when lab results are available. Overall you are doing a great job taking care of yourself! Recommend annual physical with fasting labs.

## 2018-02-02 LAB — VITAMIN D 25 HYDROXY (VIT D DEFICIENCY, FRACTURES): VIT D 25 HYDROXY: 32.2 ng/mL (ref 30.0–100.0)

## 2018-02-02 LAB — LIPID PANEL
CHOL/HDL RATIO: 3 ratio (ref 0.0–4.4)
Cholesterol, Total: 182 mg/dL (ref 100–199)
HDL: 61 mg/dL (ref >39)
LDL CALC: 112 mg/dL — AB (ref 0–99)
Triglycerides: 46 mg/dL (ref 0–149)
VLDL CHOLESTEROL CAL: 9 mg/dL (ref 5–40)

## 2018-02-02 LAB — COMPREHENSIVE METABOLIC PANEL
ALK PHOS: 53 IU/L (ref 39–117)
ALT: 9 IU/L (ref 0–32)
AST: 9 IU/L (ref 0–40)
Albumin/Globulin Ratio: 2 (ref 1.2–2.2)
Albumin: 4.3 g/dL (ref 3.5–5.5)
BILIRUBIN TOTAL: 0.4 mg/dL (ref 0.0–1.2)
BUN/Creatinine Ratio: 14 (ref 9–23)
BUN: 11 mg/dL (ref 6–20)
CHLORIDE: 106 mmol/L (ref 96–106)
CO2: 23 mmol/L (ref 20–29)
Calcium: 9.3 mg/dL (ref 8.7–10.2)
Creatinine, Ser: 0.81 mg/dL (ref 0.57–1.00)
GFR calc Af Amer: 106 mL/min/{1.73_m2} (ref >59)
GFR calc non Af Amer: 92 mL/min/{1.73_m2} (ref >59)
GLUCOSE: 95 mg/dL (ref 65–99)
Globulin, Total: 2.2 g/dL (ref 1.5–4.5)
Potassium: 4.5 mmol/L (ref 3.5–5.2)
SODIUM: 143 mmol/L (ref 134–144)
TOTAL PROTEIN: 6.5 g/dL (ref 6.0–8.5)

## 2018-02-02 LAB — TSH: TSH: 0.435 u[IU]/mL — ABNORMAL LOW (ref 0.450–4.500)

## 2018-02-02 LAB — CBC WITH DIFFERENTIAL/PLATELET
BASOS ABS: 0 10*3/uL (ref 0.0–0.2)
Basos: 0 %
EOS (ABSOLUTE): 0.1 10*3/uL (ref 0.0–0.4)
Eos: 2 %
Hematocrit: 37.9 % (ref 34.0–46.6)
Hemoglobin: 12.5 g/dL (ref 11.1–15.9)
Immature Grans (Abs): 0 10*3/uL (ref 0.0–0.1)
Immature Granulocytes: 0 %
Lymphocytes Absolute: 1.4 10*3/uL (ref 0.7–3.1)
Lymphs: 19 %
MCH: 27.6 pg (ref 26.6–33.0)
MCHC: 33 g/dL (ref 31.5–35.7)
MCV: 84 fL (ref 79–97)
MONOS ABS: 0.3 10*3/uL (ref 0.1–0.9)
Monocytes: 4 %
NEUTROS PCT: 75 %
Neutrophils Absolute: 5.5 10*3/uL (ref 1.4–7.0)
PLATELETS: 233 10*3/uL (ref 150–450)
RBC: 4.53 x10E6/uL (ref 3.77–5.28)
RDW: 12.1 % — AB (ref 12.3–15.4)
WBC: 7.4 10*3/uL (ref 3.4–10.8)

## 2018-02-02 LAB — HEMOGLOBIN A1C
Est. average glucose Bld gHb Est-mCnc: 103 mg/dL
HEMOGLOBIN A1C: 5.2 % (ref 4.8–5.6)

## 2018-02-05 LAB — T3: T3, Total: 120 ng/dL (ref 71–180)

## 2018-02-05 LAB — T4, FREE: FREE T4: 1.2 ng/dL (ref 0.82–1.77)

## 2018-02-05 LAB — SPECIMEN STATUS REPORT

## 2018-02-21 NOTE — Progress Notes (Deleted)
GYNECOLOGY  VISIT   HPI: 42 y.o.   Married White or Caucasian Not Hispanic or Latino  female   [phone] with No LMP recorded. (Menstrual status: IUD).   here for 1 month post op.  GYNECOLOGIC HISTORY: No LMP recorded. (Menstrual status: IUD). Contraception: Mirena Menopausal hormone therapy: None       OB History    Gravida  4   Para  3   Term  3   Preterm  0   AB  1   Living  3     SAB      TAB      Ectopic      Multiple      Live Births  3              Patient Active Problem List   Diagnosis Date Noted  . Healthcare maintenance 11/29/2017  . Crying with unclear etiology 11/29/2017  . Screening examination for STD (sexually transmitted disease) 11/29/2017    Past Medical History:  Diagnosis Date  . Anemia    with pregnancy  . Blood transfusion without reported diagnosis 1998, 2000, 2008  . History of gallstones   . PONV (postoperative nausea and vomiting)     Past Surgical History:  Procedure Laterality Date  . CESAREAN SECTION     x3  . CHOLECYSTECTOMY    . INTRAUTERINE DEVICE (IUD) INSERTION N/A 01/17/2018   Procedure: INTRAUTERINE DEVICE (IUD) INSERTION with ultrasound guidance;  Surgeon: Romualdo Bolk, MD;  Location: [facility];  Service: Gynecology;  Laterality: N/A;  Mirena IUD insertion under ultrasound guidance.    Current Outpatient Medications  Medication Sig Dispense Refill  . ibuprofen (ADVIL,MOTRIN) 600 MG tablet Take 600 mg by mouth every 6 (six) hours as needed.    Marland Kitchen levonorgestrel (MIRENA, 52 MG,) 20 MCG/24HR IUD Dispense one device for physician insertion in OR. 1 each 0   No current facility-administered medications for this visit.      ALLERGIES: Patient has no known allergies.  Family History  Problem Relation Age of Onset  . Stroke Mother     Social History   Socioeconomic History  . Marital status: Married    Spouse name: Not on file  . Number of children: Not on file  . Years of  education: Not on file  . Highest education level: Not on file  Occupational History  . Not on file  Social Needs  . Financial resource strain: Not on file  . Food insecurity:    Worry: Not on file    Inability: Not on file  . Transportation needs:    Medical: Not on file    Non-medical: Not on file  Tobacco Use  . Smoking status: Former Smoker    Packs/day: 1.00    Years: 19.00    Pack years: 19.00    Types: Cigarettes  . Smokeless tobacco: Never Used  Substance and Sexual Activity  . Alcohol use: No  . Drug use: No  . Sexual activity: Yes    Birth control/protection: IUD    Comment: Mirena  Lifestyle  . Physical activity:    Days per week: Not on file    Minutes per session: Not on file  . Stress: Not on file  Relationships  . Social connections:    Talks on phone: Not on file    Gets together: Not on file    Attends religious service: Not on file    Active member of club or  organization: Not on file    Attends meetings of clubs or organizations: Not on file    Relationship status: Not on file  . Intimate partner violence:    Fear of current or ex partner: Not on file    Emotionally abused: Not on file    Physically abused: Not on file    Forced sexual activity: Not on file  Other Topics Concern  . Not on file  Social History Narrative  . Not on file    ROS  PHYSICAL EXAMINATION:    There were no vitals taken for this visit.    General appearance: alert, cooperative and appears stated age Neck: no adenopathy, supple, symmetrical, trachea midline and thyroid {CHL AMB PHY EX THYROID NORM DEFAULT:[phone]::"normal to inspection and palpation"} Breasts: {Exam; breast:13139::"normal appearance, no masses or tenderness"} Abdomen: soft, non-tender; non distended, no masses,  no organomegaly  Pelvic: External genitalia:  no lesions              Urethra:  normal appearing urethra with no masses, tenderness or lesions              Bartholins and Skenes: normal                  Vagina: normal appearing vagina with normal color and discharge, no lesions              Cervix: {CHL AMB PHY EX CERVIX NORM DEFAULT:[phone]::"no lesions"}              Bimanual Exam:  Uterus:  {CHL AMB PHY EX UTERUS NORM DEFAULT:[phone]::"normal size, contour, position, consistency, mobility, non-tender"}              Adnexa: {CHL AMB PHY EX ADNEXA NO MASS DEFAULT:[phone]::"no mass, fullness, tenderness"}              Rectovaginal: {yes no:314532}.  Confirms.              Anus:  normal sphincter tone, no lesions  Chaperone was present for exam.  ASSESSMENT     PLAN    An After Visit Summary was printed and given to the patient.  *** minutes face to face time of which over 50% was spent in counseling.

## 2018-02-23 ENCOUNTER — Ambulatory Visit: Payer: BC Managed Care – PPO | Admitting: Obstetrics and Gynecology

## 2018-02-23 ENCOUNTER — Encounter: Payer: Self-pay | Admitting: Obstetrics and Gynecology

## 2018-02-23 ENCOUNTER — Telehealth: Payer: Self-pay | Admitting: Obstetrics and Gynecology

## 2018-02-23 NOTE — Telephone Encounter (Signed)
Patient did not keep her 4 week post op with Dr. Oscar La today. I called her and left a message to call back to reschedule.  Routing to provider for FYI.

## 2018-03-18 DIAGNOSIS — E559 Vitamin D deficiency, unspecified: Secondary | ICD-10-CM | POA: Insufficient documentation

## 2018-04-04 NOTE — Progress Notes (Signed)
GYNECOLOGY  VISIT   HPI: 42 y.o.   Married White or Caucasian Not Hispanic or Latino  female   [phone] [id number] with No LMP recorded. (Menstrual status: IUD).   here for post op appointment from IUD insertion. She had a mirena IUD placed in the OR with ultrasound guidance on 01/17/18. 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 She has slight spotting with intercourse, for years, even prior to the first mirena. Normal pap in 8/19.  She just moved into her first house last weekend.  GYNECOLOGIC HISTORY: No LMP recorded. (Menstrual status: IUD). Contraception: IUD Menopausal hormone therapy: None        OB History    Gravida  4   Para  3   Term  3   Preterm  0   AB  1   Living  3     SAB      TAB      Ectopic      Multiple      Live Births  3              Patient Active Problem List   Diagnosis Date Noted  . Vitamin D insufficiency 03/18/2018  . Healthcare maintenance 11/29/2017  . Crying with unclear etiology 11/29/2017  . Screening examination for STD (sexually transmitted disease) 11/29/2017    Past Medical History:  Diagnosis Date  . Anemia    with pregnancy  . Blood transfusion without reported diagnosis 1998, 2000, 2008  . History of gallstones   . PONV (postoperative nausea and vomiting)     Past Surgical History:  Procedure Laterality Date  . CESAREAN SECTION     x3  . CHOLECYSTECTOMY    . INTRAUTERINE DEVICE (IUD) INSERTION N/A 01/17/2018   Procedure: INTRAUTERINE DEVICE (IUD) INSERTION with ultrasound guidance;  Surgeon: Romualdo BolkJertson, Ethan Kasperski Evelyn, MD;  [facility] [place];  Service: Gynecology;  Laterality: N/A;  Mirena IUD insertion under ultrasound guidance.    Current Outpatient Medications  Medication Sig Dispense Refill  . ibuprofen (ADVIL,MOTRIN) 600 MG tablet Take 600 mg by mouth every 6 (six) hours as needed.    Marland Kitchen. levonorgestrel (MIRENA, 52 MG,) 20 MCG/24HR IUD Dispense one device for physician insertion in OR. 1 each 0    No current facility-administered medications for this visit.      ALLERGIES: Patient has no known allergies.  Family History  Problem Relation Age of Onset  . Stroke Mother     Social History   Socioeconomic History  . Marital status: Married    Spouse name: Not on file  . Number of children: Not on file  . Years of education: Not on file  . Highest education level: Not on file  Occupational History  . Not on file  Social Needs  . Financial resource strain: Not on file  . Food insecurity:    Worry: Not on file    Inability: Not on file  . Transportation needs:    Medical: Not on file    Non-medical: Not on file  Tobacco Use  . Smoking status: Former Smoker    Packs/day: 1.00    Years: 19.00    Pack years: 19.00    Types: Cigarettes  . Smokeless tobacco: Never Used  Substance and Sexual Activity  . Alcohol use: No  . Drug use: No  . Sexual activity: Yes    Birth control/protection: IUD    Comment: Mirena  Lifestyle  . Physical activity:  Days per week: Not on file    Minutes per session: Not on file  . Stress: Not on file  Relationships  . Social connections:    Talks on phone: Not on file    Gets together: Not on file    Attends religious service: Not on file    Active member of club or organization: Not on file    Attends meetings of clubs or organizations: Not on file    Relationship status: Not on file  . Intimate partner violence:    Fear of current or ex partner: Not on file    Emotionally abused: Not on file    Physically abused: Not on file    Forced sexual activity: Not on file  Other Topics Concern  . Not on file  Social History Narrative  . Not on file    Review of Systems  Constitutional: Negative.   HENT: Negative.   Eyes: Negative.   Respiratory: Negative.   Cardiovascular: Negative.   Gastrointestinal: Negative.   Genitourinary:       Spotting with intercourse  Musculoskeletal: Negative.   Skin: Negative.   Neurological:  Negative.   Endo/Heme/Allergies: Negative.   Psychiatric/Behavioral: Negative.     PHYSICAL EXAMINATION:    BP 118/78 (BP Location: Right Arm, Patient Position: Sitting, Cuff Size: Normal)   Pulse 60   Wt 143 lb 3.2 oz (65 kg)   BMI 24.58 kg/m     General appearance: alert, cooperative and appears stated age  Pelvic: External genitalia:  no lesions              Urethra:  normal appearing urethra with no masses, tenderness or lesions              Bartholins and Skenes: normal                 Vagina: normal appearing vagina with normal color and discharge, no lesions              Cervix: no lesions and IUD strings 3-4 cm              Bimanual Exam:  Uterus:  normal size, contour, position, consistency, mobility, non-tender              Adnexa: no mass, fullness, tenderness                Chaperone was present for exam.  ASSESSMENT IUD check, doing well    PLAN Routine f/u   An After Visit Summary was printed and given to the patient.

## 2018-04-06 ENCOUNTER — Encounter: Payer: Self-pay | Admitting: Obstetrics and Gynecology

## 2018-04-06 ENCOUNTER — Ambulatory Visit: Payer: BC Managed Care – PPO | Admitting: Obstetrics and Gynecology

## 2018-04-06 ENCOUNTER — Other Ambulatory Visit: Payer: Self-pay

## 2018-04-06 VITALS — BP 118/78 | HR 60 | Wt 143.2 lb

## 2018-04-06 DIAGNOSIS — Z30431 Encounter for routine checking of intrauterine contraceptive device: Secondary | ICD-10-CM

## 2018-06-29 ENCOUNTER — Ambulatory Visit: Payer: BC Managed Care – PPO | Admitting: Adult Health

## 2018-12-13 NOTE — Progress Notes (Deleted)
43 y.o. [id number] Married White or Caucasian Not Hispanic or Latino female here for annual exam.      No LMP recorded. (Menstrual status: IUD).          Sexually active: {yes no:314532}  The current method of family planning is IUD.  Mirena placed 01/17/18   Exercising: {yes no:314532}  {types:19826} Smoker:  {YES NO:22349}  Health Maintenance: Pap:  12/07/2017 WNL NEG HPV History of abnormal Pap:  Yes years ago per patient, biopsies done WNL, no surgery on her cervix.  MMG:  None Colonoscopy:  None BMD:   N/A TDaP:  11/29/2017 Gardasil: no   reports that she has quit smoking. Her smoking use included cigarettes. She has a 19.00 pack-year smoking history. She has never used smokeless tobacco. She reports that she does not drink alcohol or use drugs.  Past Medical History:  Diagnosis Date  . Anemia    with pregnancy  . Blood transfusion without reported diagnosis 1998, 2000, 2008  . History of gallstones   . PONV (postoperative nausea and vomiting)     Past Surgical History:  Procedure Laterality Date  . CESAREAN SECTION     x3  . CHOLECYSTECTOMY    . INTRAUTERINE DEVICE (IUD) INSERTION N/A 01/17/2018   Procedure: INTRAUTERINE DEVICE (IUD) INSERTION with ultrasound guidance;  Surgeon: Romualdo BolkJertson, Jill Evelyn, MD;  [facility] [place];  Service: Gynecology;  Laterality: N/A;  Mirena IUD insertion under ultrasound guidance.    Current Outpatient Medications  Medication Sig Dispense Refill  . ibuprofen (ADVIL,MOTRIN) 600 MG tablet Take 600 mg by mouth every 6 (six) hours as needed.    Marland Kitchen. levonorgestrel (MIRENA, 52 MG,) 20 MCG/24HR IUD Dispense one device for physician insertion in OR. 1 each 0   No current facility-administered medications for this visit.     Family History  Problem Relation Age of Onset  . Stroke Mother     Review of Systems  Exam:   There were no vitals taken for this visit.  Weight change: @WEIGHTCHANGE @ Height:      Ht Readings from Last 3  Encounters:  02/01/18 5\' 4"  (1.626 m)  01/17/18 5\' 4"  (1.626 m)  12/07/17 5\' 4"  (1.626 m)    General appearance: alert, cooperative and appears stated age Head: Normocephalic, without obvious abnormality, atraumatic Neck: no adenopathy, supple, symmetrical, trachea midline and thyroid {CHL AMB PHY EX THYROID NORM DEFAULT:[phone]::"normal to inspection and palpation"} Lungs: clear to auscultation bilaterally Cardiovascular: regular rate and rhythm Breasts: {Exam; breast:13139::"normal appearance, no masses or tenderness"} Abdomen: soft, non-tender; non distended,  no masses,  no organomegaly Extremities: extremities normal, atraumatic, no cyanosis or edema Skin: Skin color, texture, turgor normal. No rashes or lesions Lymph nodes: Cervical, supraclavicular, and axillary nodes normal. No abnormal inguinal nodes palpated Neurologic: Grossly normal   Pelvic: External genitalia:  no lesions              Urethra:  normal appearing urethra with no masses, tenderness or lesions              Bartholins and Skenes: normal                 Vagina: normal appearing vagina with normal color and discharge, no lesions              Cervix: {CHL AMB PHY EX CERVIX NORM [phone]::"no lesions"}               Bimanual Exam:  Uterus:  {CHL AMB PHY  EX UTERUS NORM [phone]::"normal size, contour, position, consistency, mobility, non-tender"}              Adnexa: {CHL AMB PHY EX ADNEXA NO MASS [phone]::"no mass, fullness, tenderness"}               Rectovaginal: Confirms               Anus:  normal sphincter tone, no lesions  Chaperone was present for exam.  A:  Well Woman with normal exam  P:

## 2018-12-14 ENCOUNTER — Telehealth: Payer: Self-pay | Admitting: Obstetrics and Gynecology

## 2018-12-14 ENCOUNTER — Ambulatory Visit: Payer: BC Managed Care – PPO | Admitting: Obstetrics and Gynecology

## 2018-12-14 NOTE — Telephone Encounter (Signed)
Patient [facility] today's appointment. Added to recall list.

## 2019-02-21 ENCOUNTER — Telehealth: Payer: Self-pay | Admitting: Obstetrics and Gynecology

## 2019-02-21 NOTE — Telephone Encounter (Signed)
Call placed to pt. Pt having UTI symptoms of pain, no other symptoms. Started x 1 week ago.  Pt states would also like STD check. Pt states "wants STD check d/t husband not being faithful". Scheduled OV 10/22 at 1100.   Also wants to make AEX. Scheduled for 11/12 at 1030.   Routing to Dr Talbert Nan for final review.

## 2019-02-21 NOTE — Telephone Encounter (Signed)
Patient sent the following request through [place]. Routing to triage to assist patient with request.  Appointment Request From: Einar Gip  With Provider: Salvadore Dom, MD [facility]]  Preferred Date Range: 02/24/2019 - 02/27/2019  Preferred Times: Monday Morning, Friday Morning  Reason for visit: Request an Appointment  Comments: Yearly check up and I think I have a UTI and a std check up

## 2019-02-22 ENCOUNTER — Other Ambulatory Visit: Payer: Self-pay

## 2019-02-23 ENCOUNTER — Ambulatory Visit: Payer: BC Managed Care – PPO | Admitting: Obstetrics and Gynecology

## 2019-02-23 ENCOUNTER — Telehealth: Payer: Self-pay | Admitting: Obstetrics and Gynecology

## 2019-02-23 ENCOUNTER — Encounter: Payer: Self-pay | Admitting: Obstetrics and Gynecology

## 2019-02-23 VITALS — BP 120/62 | HR 62 | Temp 97.3°F | Wt 149.0 lb

## 2019-02-23 DIAGNOSIS — N898 Other specified noninflammatory disorders of vagina: Secondary | ICD-10-CM | POA: Diagnosis not present

## 2019-02-23 DIAGNOSIS — R300 Dysuria: Secondary | ICD-10-CM

## 2019-02-23 DIAGNOSIS — Z113 Encounter for screening for infections with a predominantly sexual mode of transmission: Secondary | ICD-10-CM

## 2019-02-23 LAB — POCT URINALYSIS DIPSTICK
Bilirubin, UA: NEGATIVE
Blood, UA: NEGATIVE
Glucose, UA: NEGATIVE
Ketones, UA: NEGATIVE
Leukocytes, UA: NEGATIVE
Nitrite, UA: NEGATIVE
Protein, UA: NEGATIVE
Spec Grav, UA: 1.01 (ref 1.010–1.025)
Urobilinogen, UA: 0.2 E.U./dL
pH, UA: 5 (ref 5.0–8.0)

## 2019-02-23 NOTE — Telephone Encounter (Signed)
Patient rescheduled to 11:30am today.   Encounter closed.

## 2019-02-23 NOTE — Telephone Encounter (Signed)
Patient went to wrong office for today's appointment. Would like to know if she can still be fit in to be seen today.

## 2019-02-23 NOTE — Progress Notes (Signed)
GYNECOLOGY  VISIT   HPI: 43 y.o.   White or Caucasian Not Hispanic or Latino  female   [phone] with No LMP recorded. (Menstrual status: IUD).   here for burning that occurs at the end of her stream with urination. Reports vaginal odor without discharge. She c/o a pinching pain in the SP region at the end of voiding, started 4-5 days ago, started feeling better yesterday, currently feels better. C/O urinary frequency, large amount. Mild urinary urgency. She c/o intermittent vaginal odor for the last 2 weeks. Gets worse with intercourse.  IUD is working well.  She has some concerns that her long term partner (14 years) might be cheating on her. They live together.  GYNECOLOGIC HISTORY: No LMP recorded. (Menstrual status: IUD). Contraception: IUD Menopausal hormone therapy: None        OB History    Gravida  4   Para  3   Term  3   Preterm  0   AB  1   Living  3     SAB      TAB      Ectopic      Multiple      Live Births  3              Patient Active Problem List   Diagnosis Date Noted  . Vitamin D insufficiency 03/18/2018  . Healthcare maintenance 11/29/2017  . Crying with unclear etiology 11/29/2017  . Screening examination for STD (sexually transmitted disease) 11/29/2017    Past Medical History:  Diagnosis Date  . Anemia    with pregnancy  . Blood transfusion without reported diagnosis 1998, 2000, 2008  . History of gallstones   . PONV (postoperative nausea and vomiting)     Past Surgical History:  Procedure Laterality Date  . CESAREAN SECTION     x3  . CHOLECYSTECTOMY    . INTRAUTERINE DEVICE (IUD) INSERTION N/A 01/17/2018   Procedure: INTRAUTERINE DEVICE (IUD) INSERTION with ultrasound guidance;  Surgeon: Romualdo Bolk, MD;  Location: [facility];  Service: Gynecology;  Laterality: N/A;  Mirena IUD insertion under ultrasound guidance.    Current Outpatient Medications  Medication Sig Dispense Refill  . ibuprofen  (ADVIL,MOTRIN) 600 MG tablet Take 600 mg by mouth every 6 (six) hours as needed.    Marland Kitchen levonorgestrel (MIRENA, 52 MG,) 20 MCG/24HR IUD Dispense one device for physician insertion in OR. 1 each 0   No current facility-administered medications for this visit.      ALLERGIES: Patient has no known allergies.  Family History  Problem Relation Age of Onset  . Stroke Mother     Social History   Socioeconomic History  . Marital status: Married    Spouse name: Not on file  . Number of children: Not on file  . Years of education: Not on file  . Highest education level: Not on file  Occupational History  . Not on file  Social Needs  . Financial resource strain: Not on file  . Food insecurity    Worry: Not on file    Inability: Not on file  . Transportation needs    Medical: Not on file    Non-medical: Not on file  Tobacco Use  . Smoking status: Former Smoker    Packs/day: 1.00    Years: 19.00    Pack years: 19.00    Types: Cigarettes  . Smokeless tobacco: Never Used  Substance and Sexual Activity  . Alcohol use: No  .  Drug use: No  . Sexual activity: Yes    Birth control/protection: I.U.D.    Comment: Mirena  Lifestyle  . Physical activity    Days per week: Not on file    Minutes per session: Not on file  . Stress: Not on file  Relationships  . Social Herbalist on phone: Not on file    Gets together: Not on file    Attends religious service: Not on file    Active member of club or organization: Not on file    Attends meetings of clubs or organizations: Not on file    Relationship status: Not on file  . Intimate partner violence    Fear of current or ex partner: Not on file    Emotionally abused: Not on file    Physically abused: Not on file    Forced sexual activity: Not on file  Other Topics Concern  . Not on file  Social History Narrative  . Not on file    Review of Systems  Constitutional: Negative.   Eyes: Negative.   Respiratory: Negative.    Cardiovascular: Negative.   Gastrointestinal: Negative.   Genitourinary: Positive for dysuria.       Vaginal odor  Musculoskeletal: Negative.   Skin: Negative.   Neurological: Negative.   Endo/Heme/Allergies: Negative.   Psychiatric/Behavioral: Negative.     PHYSICAL EXAMINATION:    BP 120/62 (BP Location: Left Arm, Patient Position: Sitting, Cuff Size: Normal)   Pulse 62   Temp (!) 97.3 F (36.3 C) (Skin)   Wt 149 lb (67.6 kg)   BMI 25.58 kg/m     General appearance: alert, cooperative and appears stated age CVA: not tender Abdomen: soft, non-tender; non distended, no masses,  no organomegaly CVA: not tender  Pelvic: External genitalia:  no lesions              Urethra:  normal appearing urethra with no masses, tenderness or lesions              Bartholins and Skenes: normal                 Vagina: normal appearing vagina with normal color and discharge, no lesions              Cervix: no lesions and IUD string 3-4 cm              Bimanual Exam:  Uterus:  normal size, contour, position, consistency, mobility, non-tender              Adnexa: no mass, fullness, tenderness               Chaperone was present for exam.  Urine dip: negative  Wet prep: ? clue, no trich, few wbc KOH: no yeast PH: 4.5   ASSESSMENT Dysuria, negative dip, symptoms improving Vaginal odor, slides not clear Concerns for STD's    PLAN Urine for ua, c&s Affirm, treatment depending on results STD testing Call if she wants the names of counselors   An After Visit Summary was printed and given to the patient.  Over 15 minutes face to face time of which over 50% was spent in counseling.

## 2019-02-23 NOTE — Progress Notes (Deleted)
GYNECOLOGY  VISIT   HPI: 43 y.o.   Married White or Caucasian Not Hispanic or Latino  female   [phone] with No LMP recorded. (Menstrual status: IUD).   here for     GYNECOLOGIC HISTORY: No LMP recorded. (Menstrual status: IUD). Contraception:*** Menopausal hormone therapy: ***        OB History    Gravida  4   Para  3   Term  3   Preterm  0   AB  1   Living  3     SAB      TAB      Ectopic      Multiple      Live Births  3              Patient Active Problem List   Diagnosis Date Noted  . Vitamin D insufficiency 03/18/2018  . Healthcare maintenance 11/29/2017  . Crying with unclear etiology 11/29/2017  . Screening examination for STD (sexually transmitted disease) 11/29/2017    Past Medical History:  Diagnosis Date  . Anemia    with pregnancy  . Blood transfusion without reported diagnosis 1998, 2000, 2008  . History of gallstones   . PONV (postoperative nausea and vomiting)     Past Surgical History:  Procedure Laterality Date  . CESAREAN SECTION     x3  . CHOLECYSTECTOMY    . INTRAUTERINE DEVICE (IUD) INSERTION N/A 01/17/2018   Procedure: INTRAUTERINE DEVICE (IUD) INSERTION with ultrasound guidance;  Surgeon: Salvadore Dom, MD;  Location: [facility];  Service: Gynecology;  Laterality: N/A;  Mirena IUD insertion under ultrasound guidance.    Current Outpatient Medications  Medication Sig Dispense Refill  . ibuprofen (ADVIL,MOTRIN) 600 MG tablet Take 600 mg by mouth every 6 (six) hours as needed.    Marland Kitchen levonorgestrel (MIRENA, 52 MG,) 20 MCG/24HR IUD Dispense one device for physician insertion in OR. 1 each 0   No current facility-administered medications for this visit.      ALLERGIES: Patient has no known allergies.  Family History  Problem Relation Age of Onset  . Stroke Mother     Social History   Socioeconomic History  . Marital status: Married    Spouse name: Not on file  . Number of children: Not on file   . Years of education: Not on file  . Highest education level: Not on file  Occupational History  . Not on file  Social Needs  . Financial resource strain: Not on file  . Food insecurity    Worry: Not on file    Inability: Not on file  . Transportation needs    Medical: Not on file    Non-medical: Not on file  Tobacco Use  . Smoking status: Former Smoker    Packs/day: 1.00    Years: 19.00    Pack years: 19.00    Types: Cigarettes  . Smokeless tobacco: Never Used  Substance and Sexual Activity  . Alcohol use: No  . Drug use: No  . Sexual activity: Yes    Birth control/protection: I.U.D.    Comment: Mirena  Lifestyle  . Physical activity    Days per week: Not on file    Minutes per session: Not on file  . Stress: Not on file  Relationships  . Social Herbalist on phone: Not on file    Gets together: Not on file    Attends religious service: Not on file  Active member of club or organization: Not on file    Attends meetings of clubs or organizations: Not on file    Relationship status: Not on file  . Intimate partner violence    Fear of current or ex partner: Not on file    Emotionally abused: Not on file    Physically abused: Not on file    Forced sexual activity: Not on file  Other Topics Concern  . Not on file  Social History Narrative  . Not on file    ROS  PHYSICAL EXAMINATION:    There were no vitals taken for this visit.    General appearance: alert, cooperative and appears stated age Neck: no adenopathy, supple, symmetrical, trachea midline and thyroid {CHL AMB PHY EX THYROID NORM [phone]::"normal to inspection and palpation"} Breasts: {Exam; breast:13139::"normal appearance, no masses or tenderness"} Abdomen: soft, non-tender; non distended, no masses,  no organomegaly  Pelvic: External genitalia:  no lesions              Urethra:  normal appearing urethra with no masses, tenderness or lesions              Bartholins and  Skenes: normal                 Vagina: normal appearing vagina with normal color and discharge, no lesions              Cervix: {CHL AMB PHY EX CERVIX NORM DEFAULT:[phone]::"no lesions"}              Bimanual Exam:  Uterus:  {CHL AMB PHY EX UTERUS NORM DEFAULT:[phone]::"normal size, contour, position, consistency, mobility, non-tender"}              Adnexa: {CHL AMB PHY EX ADNEXA NO MASS DEFAULT:[phone]::"no mass, fullness, tenderness"}              Rectovaginal: {yes no:314532}.  Confirms.              Anus:  normal sphincter tone, no lesions  Chaperone was present for exam.  ASSESSMENT     PLAN    An After Visit Summary was printed and given to the patient.  *** minutes face to face time of which over 50% was spent in counseling.

## 2019-02-24 LAB — GC/CHLAMYDIA PROBE AMP
Chlamydia trachomatis, NAA: NEGATIVE
Neisseria Gonorrhoeae by PCR: NEGATIVE

## 2019-02-24 LAB — URINALYSIS, MICROSCOPIC ONLY
Bacteria, UA: NONE SEEN
Casts: NONE SEEN /lpf
RBC, Urine: NONE SEEN /hpf (ref 0–2)

## 2019-02-24 LAB — VAGINITIS/VAGINOSIS, DNA PROBE
Candida Species: NEGATIVE
Gardnerella vaginalis: POSITIVE — AB
Trichomonas vaginosis: NEGATIVE

## 2019-02-24 LAB — HEP, RPR, HIV PANEL
HIV Screen 4th Generation wRfx: NONREACTIVE
Hepatitis B Surface Ag: NEGATIVE
RPR Ser Ql: NONREACTIVE

## 2019-02-25 ENCOUNTER — Encounter: Payer: Self-pay | Admitting: Obstetrics and Gynecology

## 2019-02-26 LAB — URINE CULTURE

## 2019-02-27 ENCOUNTER — Telehealth: Payer: Self-pay | Admitting: Obstetrics and Gynecology

## 2019-02-27 MED ORDER — SULFAMETHOXAZOLE-TRIMETHOPRIM 800-160 MG PO TABS: 1.0000 | ORAL_TABLET | Freq: Two times a day (BID) | ORAL | 0 refills | Status: AC

## 2019-02-27 MED ORDER — PHENAZOPYRIDINE HCL 200 MG PO TABS: 200.0000 mg | ORAL_TABLET | Freq: Three times a day (TID) | ORAL | 0 refills | Status: AC | PRN

## 2019-02-27 MED ORDER — METRONIDAZOLE 500 MG PO TABS: 500.0000 mg | ORAL_TABLET | Freq: Two times a day (BID) | ORAL | 0 refills | Status: DC

## 2019-02-27 NOTE — Telephone Encounter (Signed)
Spoke with patient, advised per Dr. Talbert Nan. Rx for pyridium, bactrim DS and Flagyl to verified pharmacy. ETOH precautions reviewed. Patient verbalizes understanding and is agreeable.   Encounter closed.

## 2019-02-27 NOTE — Telephone Encounter (Signed)
Patient seen in office on 02/23/2019.   Dr. Talbert Nan -please review patients MyChart message and Urine culture results and advise.

## 2019-02-27 NOTE — Telephone Encounter (Signed)
Patient sent the following correspondence through [place].  Hey Dr. Talbert Dawn Russo.  The pain has gotten worse for peeing and now I feel like I am having to go pee all the time but hardly anything is coming out. There is also blood in the toilet when I pee as well.

## 2019-02-27 NOTE — Telephone Encounter (Signed)
-----   Message from Salvadore Dom, MD sent at 02/27/2019  8:21 AM EDT ----- Please inform the patient that her vaginitis probe was + for BV and treat with flagyl (either oral or vaginal, her choice), no ETOH while on Flagyl.  Oral: Flagyl 500 mg BID x 7 days, or Vaginal: Metrogel, 1 applicator per vagina q day x 5 days. Also + for UTI, treat with bactrim ds, 1 po BID x 3 days. Pyridium 200 mg TID x 2 days.  STD testing is negative.

## 2019-02-28 NOTE — Progress Notes (Deleted)
   Subjective:    Patient ID: Dawn Russo, female    DOB: 1975/09/01, 43 y.o.   MRN: [id number]  HPI:  Dawn Russo presents with  She was seen by her OB/GYN 02/23/2019 Please inform the patient that her vaginitis probe was + for BV and treat with flagyl (either oral or vaginal, her choice), no ETOH while on Flagyl.  Oral: Flagyl 500 mg BID x 7 days, or Vaginal: Metrogel, 1 applicator per vagina q day x 5 days.  Also + for UTI, treat with bactrim ds, 1 po BID x 3 days.  Pyridium 200 mg TID x 2 days.  STD testing is negative.  Patient Care Team    Relationship Specialty Notifications Start End  Esaw Grandchild, NP PCP - General Family Medicine  10/25/17     Patient Active Problem List   Diagnosis Date Noted  . Vitamin D insufficiency 03/18/2018  . Healthcare maintenance 11/29/2017  . Crying with unclear etiology 11/29/2017  . Screening examination for STD (sexually transmitted disease) 11/29/2017     Past Medical History:  Diagnosis Date  . Anemia    with pregnancy  . Blood transfusion without reported diagnosis 1998, 2000, 2008  . History of gallstones   . PONV (postoperative nausea and vomiting)      Past Surgical History:  Procedure Laterality Date  . CESAREAN SECTION     x3  . CHOLECYSTECTOMY    . INTRAUTERINE DEVICE (IUD) INSERTION N/A 01/17/2018   Procedure: INTRAUTERINE DEVICE (IUD) INSERTION with ultrasound guidance;  Surgeon: Salvadore Dom, MD;  Location: [facility];  Service: Gynecology;  Laterality: N/A;  Mirena IUD insertion under ultrasound guidance.     Family History  Problem Relation Age of Onset  . Stroke Mother      Social History   Substance and Sexual Activity  Drug Use No     Social History   Substance and Sexual Activity  Alcohol Use No     Social History   Tobacco Use  Smoking Status Former Smoker  . Packs/day: 1.00  . Years: 19.00  . Pack years: 19.00  . Types: Cigarettes  Smokeless Tobacco Never  Used     Outpatient Encounter Medications as of 03/01/2019  Medication Sig  . ibuprofen (ADVIL,MOTRIN) 600 MG tablet Take 600 mg by mouth every 6 (six) hours as needed.  Marland Kitchen levonorgestrel (MIRENA, 52 MG,) 20 MCG/24HR IUD Dispense one device for physician insertion in OR.  . metroNIDAZOLE (FLAGYL) 500 MG tablet Take 1 tablet (500 mg total) by mouth 2 (two) times daily.  . phenazopyridine (PYRIDIUM) 200 MG tablet Take 1 tablet (200 mg total) by mouth 3 (three) times daily as needed for up to 2 days for pain.  Marland Kitchen sulfamethoxazole-trimethoprim (BACTRIM DS) 800-160 MG tablet Take 1 tablet by mouth 2 (two) times daily for 3 days.   No facility-administered encounter medications on file as of 03/01/2019.     Allergies: Patient has no known allergies.  There is no height or weight on file to calculate BMI.  There were no vitals taken for this visit.     Review of Systems     Objective:   Physical Exam        Assessment & Plan:  No diagnosis found.  No problem-specific Assessment & Plan notes found for this encounter.    FOLLOW-UP:  No follow-ups on file.

## 2019-03-01 ENCOUNTER — Ambulatory Visit: Payer: BC Managed Care – PPO | Admitting: Adult Health

## 2019-03-13 NOTE — Progress Notes (Deleted)
43 y.o. [id number] Domestic Partner White or Caucasian Not Hispanic or Latino female here for annual exam.      No LMP recorded. (Menstrual status: IUD).          Sexually active: {yes no:314532}  The current method of family planning is {contraception:315051}.    Exercising: {yes no:314532}  {types:19826} Smoker:  {YES NO:22349}  Health Maintenance: Pap:  12/07/2017 WNL NEG HPV History of abnormal Pap:  Yes years ago per patient, biopsies done WNL, no surgery on her cervix.  MMG:  None     Colonoscopy:  None BMD:   N/A TDaP:  11/29/2017 Gardasil: Unsure   reports that she has quit smoking. Her smoking use included cigarettes. She has a 19.00 pack-year smoking history. She has never used smokeless tobacco. She reports that she does not drink alcohol or use drugs.  Past Medical History:  Diagnosis Date  . Anemia    with pregnancy  . Blood transfusion without reported diagnosis 1998, 2000, 2008  . History of gallstones   . PONV (postoperative nausea and vomiting)     Past Surgical History:  Procedure Laterality Date  . CESAREAN SECTION     x3  . CHOLECYSTECTOMY    . INTRAUTERINE DEVICE (IUD) INSERTION N/A 01/17/2018   Procedure: INTRAUTERINE DEVICE (IUD) INSERTION with ultrasound guidance;  Surgeon: Salvadore Dom, MD;  Location: [facility];  Service: Gynecology;  Laterality: N/A;  Mirena IUD insertion under ultrasound guidance.    Current Outpatient Medications  Medication Sig Dispense Refill  . ibuprofen (ADVIL,MOTRIN) 600 MG tablet Take 600 mg by mouth every 6 (six) hours as needed.    Marland Kitchen levonorgestrel (MIRENA, 52 MG,) 20 MCG/24HR IUD Dispense one device for physician insertion in OR. 1 each 0  . metroNIDAZOLE (FLAGYL) 500 MG tablet Take 1 tablet (500 mg total) by mouth 2 (two) times daily. 14 tablet 0   No current facility-administered medications for this visit.     Family History  Problem Relation Age of Onset  . Stroke Mother     Review of  Systems  Exam:   There were no vitals taken for this visit.  Weight change: @WEIGHTCHANGE @ Height:      Ht Readings from Last 3 Encounters:  02/01/18 5\' 4"  (1.626 m)  01/17/18 5\' 4"  (1.626 m)  12/07/17 5\' 4"  (1.626 m)    General appearance: alert, cooperative and appears stated age Head: Normocephalic, without obvious abnormality, atraumatic Neck: no adenopathy, supple, symmetrical, trachea midline and thyroid {CHL AMB PHY EX THYROID NORM DEFAULT:[phone]::"normal to inspection and palpation"} Lungs: clear to auscultation bilaterally Cardiovascular: regular rate and rhythm Breasts: {Exam; breast:13139::"normal appearance, no masses or tenderness"} Abdomen: soft, non-tender; non distended,  no masses,  no organomegaly Extremities: extremities normal, atraumatic, no cyanosis or edema Skin: Skin color, texture, turgor normal. No rashes or lesions Lymph nodes: Cervical, supraclavicular, and axillary nodes normal. No abnormal inguinal nodes palpated Neurologic: Grossly normal   Pelvic: External genitalia:  no lesions              Urethra:  normal appearing urethra with no masses, tenderness or lesions              Bartholins and Skenes: normal                 Vagina: normal appearing vagina with normal color and discharge, no lesions              Cervix: {CHL AMB PHY EX CERVIX NORM  DEFAULT:[phone]::"no lesions"}               Bimanual Exam:  Uterus:  {CHL AMB PHY EX UTERUS NORM DEFAULT:[phone]::"normal size, contour, position, consistency, mobility, non-tender"}              Adnexa: {CHL AMB PHY EX ADNEXA NO MASS DEFAULT:[phone]::"no mass, fullness, tenderness"}               Rectovaginal: Confirms               Anus:  normal sphincter tone, no lesions  Chaperone was present for exam.  A:  Well Woman with normal exam  P:

## 2019-03-16 ENCOUNTER — Ambulatory Visit: Payer: BC Managed Care – PPO | Admitting: Obstetrics and Gynecology

## 2019-03-16 ENCOUNTER — Encounter: Payer: Self-pay | Admitting: Obstetrics and Gynecology

## 2019-04-13 ENCOUNTER — Telehealth: Payer: Self-pay | Admitting: Obstetrics and Gynecology

## 2019-04-14 ENCOUNTER — Encounter: Payer: Self-pay | Admitting: Certified Nurse Midwife

## 2019-04-14 ENCOUNTER — Other Ambulatory Visit: Payer: Self-pay

## 2019-04-14 ENCOUNTER — Ambulatory Visit: Payer: BC Managed Care – PPO | Admitting: Certified Nurse Midwife

## 2019-04-14 ENCOUNTER — Ambulatory Visit: Payer: BC Managed Care – PPO | Admitting: Obstetrics and Gynecology

## 2019-04-14 VITALS — Wt 152.0 lb

## 2019-04-14 DIAGNOSIS — N898 Other specified noninflammatory disorders of vagina: Secondary | ICD-10-CM

## 2019-04-14 DIAGNOSIS — N390 Urinary tract infection, site not specified: Secondary | ICD-10-CM | POA: Diagnosis not present

## 2019-04-14 DIAGNOSIS — R319 Hematuria, unspecified: Secondary | ICD-10-CM

## 2019-04-14 LAB — POCT URINALYSIS DIPSTICK
Bilirubin, UA: NEGATIVE
Glucose, UA: NEGATIVE
Ketones, UA: NEGATIVE
Nitrite, UA: NEGATIVE
Protein, UA: POSITIVE — AB
Urobilinogen, UA: NEGATIVE E.U./dL — AB
pH, UA: 5 (ref 5.0–8.0)

## 2019-04-14 MED ORDER — PHENAZOPYRIDINE HCL 100 MG PO TABS: 100.0000 mg | ORAL_TABLET | Freq: Three times a day (TID) | ORAL | 0 refills | Status: DC | PRN

## 2019-04-14 MED ORDER — NITROFURANTOIN MONOHYD MACRO 100 MG PO CAPS: ORAL_CAPSULE | ORAL | 0 refills | Status: DC

## 2019-04-14 NOTE — Patient Instructions (Signed)
Urinary Tract Infection, Adult A urinary tract infection (UTI) is an infection of any part of the urinary tract. The urinary tract includes:  The kidneys.  The ureters.  The bladder.  The urethra. These organs make, store, and get rid of pee (urine) in the body. What are the causes? This is caused by germs (bacteria) in your genital area. These germs grow and cause swelling (inflammation) of your urinary tract. What increases the risk? You are more likely to develop this condition if:  You have a small, thin tube (catheter) to drain pee.  You cannot control when you pee or poop (incontinence).  You are female, and: ? You use these methods to prevent pregnancy: ? A medicine that kills sperm (spermicide). ? A device that blocks sperm (diaphragm). ? You have low levels of a female hormone (estrogen). ? You are pregnant.  You have genes that add to your risk.  You are sexually active.  You take antibiotic medicines.  You have trouble peeing because of: ? A prostate that is bigger than normal, if you are female. ? A blockage in the part of your body that drains pee from the bladder (urethra). ? A kidney stone. ? A nerve condition that affects your bladder (neurogenic bladder). ? Not getting enough to drink. ? Not peeing often enough.  You have other conditions, such as: ? Diabetes. ? A weak disease-fighting system (immune system). ? Sickle cell disease. ? Gout. ? Injury of the spine. What are the signs or symptoms? Symptoms of this condition include:  Needing to pee right away (urgently).  Peeing often.  Peeing small amounts often.  Pain or burning when peeing.  Blood in the pee.  Pee that smells bad or not like normal.  Trouble peeing.  Pee that is cloudy.  Fluid coming from the vagina, if you are female.  Pain in the belly or lower back. Other symptoms include:  Throwing up (vomiting).  No urge to eat.  Feeling mixed up (confused).  Being tired  and grouchy (irritable).  A fever.  Watery poop (diarrhea). How is this treated? This condition may be treated with:  Antibiotic medicine.  Other medicines.  Drinking enough water. Follow these instructions at home:  Medicines  Take over-the-counter and prescription medicines only as told by your doctor.  If you were prescribed an antibiotic medicine, take it as told by your doctor. Do not stop taking it even if you start to feel better. General instructions  Make sure you: ? Pee until your bladder is empty. ? Do not hold pee for a long time. ? Empty your bladder after sex. ? Wipe from front to back after pooping if you are a female. Use each tissue one time when you wipe.  Drink enough fluid to keep your pee pale yellow.  Keep all follow-up visits as told by your doctor. This is important. Contact a doctor if:  You do not get better after 1-2 days.  Your symptoms go away and then come back. Get help right away if:  You have very bad back pain.  You have very bad pain in your lower belly.  You have a fever.  You are sick to your stomach (nauseous).  You are throwing up. Summary  A urinary tract infection (UTI) is an infection of any part of the urinary tract.  This condition is caused by germs in your genital area.  There are many risk factors for a UTI. These include having a small, thin   tube to drain pee and not being able to control when you pee or poop.  Treatment includes antibiotic medicines for germs.  Drink enough fluid to keep your pee pale yellow. This information is not intended to replace advice given to you by your health care provider. Make sure you discuss any questions you have with your health care provider. Document Released: 10/07/2007 Document Revised: 04/07/2018 Document Reviewed: 10/28/2017 Elsevier Patient Education  2020 [employer]  

## 2019-04-14 NOTE — Progress Notes (Signed)
43 y.o. Domestic Partner Caucasian female [phone] here with complaint of UTI, with onset  on 2 days ago with sudden onset of pinching pain. . Patient complaining of urinary frequency/urgency/ and burning with urination and slight pink tinge with wiping after urination. Pain occasional with urination. Patient denies fever, chills, nausea or back pain. Slight back pain, but feels not related to urinary symptoms. Some vaginal odor, no itching or burning. No new personal products. Does wear thongs which can increase risk of UTI. Patient feels may have been related to sexual activity. Denies any vaginal symptoms.    Contraception is IUD inserted 01/17/2018.Marland Kitchen Patient not drinking adequate water intake, drinking Pepsi only now with one glass of water daily.  Review of Systems  Constitutional: Negative.   HENT: Negative.   Eyes: Negative.   Respiratory: Negative.   Cardiovascular: Negative.   Gastrointestinal: Negative.   Genitourinary: Positive for dysuria.  Musculoskeletal: Negative.   Skin: Negative.   Neurological: Negative.   Endo/Heme/Allergies: Negative.   Psychiatric/Behavioral: Negative.     O: Healthy female WDWN Affect: Normal, orientation x 3 Skin : warm and dry CVAT: negative bilateral Abdomen: positive for suprapubic tenderness  Pelvic exam: External genital area: normal, no lesions Bladder,Urethra tender, Urethral meatus: tender, red Vagina: watery vaginal discharge, noted affirm taken normal appearance   Cervix: normal, non tender Uterus:normal,non tender Adnexa: normal non tender, no fullness or masses  poct urine-rbc tr, wbc tr, protein + A: UTI suspect post coital Inadequate hydration with water Normal pelvic exam R/O vaginal infection  P: Reviewed findings of UTI and need for treatment. UK:GURKYHCW  100 mg see order with instructions Rx Pyridium 100 mg see order with instructions CBJ:SEGBT micro, culture Reviewed warning signs and symptoms of UTI and need to advise  if occurring. Discussed suspect post coital related and will wait for culture to advise. If so would use Macrobid after sexual activity. Discussed emptying bladder before and after sexual activity to help with prevention. Stop excessive Pepsi use and concentrate on water. Encouraged to limit soda, tea, and coffee also.Questions addressed. Lab: affirm will treat if vaginal infection noted.   RV prn

## 2019-04-15 LAB — URINALYSIS, MICROSCOPIC ONLY: Casts: NONE SEEN /lpf

## 2019-04-15 LAB — VAGINITIS/VAGINOSIS, DNA PROBE
Candida Species: NEGATIVE
Gardnerella vaginalis: NEGATIVE
Trichomonas vaginosis: NEGATIVE

## 2019-04-16 LAB — URINE CULTURE

## 2019-04-17 ENCOUNTER — Other Ambulatory Visit: Payer: Self-pay | Admitting: Certified Nurse Midwife

## 2019-04-17 DIAGNOSIS — N390 Urinary tract infection, site not specified: Secondary | ICD-10-CM

## 2019-04-17 MED ORDER — NITROFURANTOIN MONOHYD MACRO 100 MG PO CAPS: ORAL_CAPSULE | ORAL | 0 refills | Status: DC

## 2019-04-17 NOTE — Progress Notes (Signed)
Notify patient her urine culture showed E.Coli as we suspected. Macrobid is appropriate medication for use. Patient status? We discussed post coital use.Have sent Rx Macrobid to pharmacy for her to use for this. She should follow instructions provided and call with any concerns or questions.

## 2019-07-21 ENCOUNTER — Encounter: Payer: Self-pay | Admitting: Certified Nurse Midwife

## 2019-11-22 ENCOUNTER — Telehealth: Payer: Self-pay | Admitting: Obstetrics and Gynecology

## 2019-11-22 NOTE — Telephone Encounter (Signed)
Patient is returning a call to Jill. °

## 2019-11-22 NOTE — Telephone Encounter (Signed)
Appointment Request From: Ruben Im    With Provider: Romualdo Bolk, MD [facility]]    Preferred Date Range: 11/23/2019 - 11/27/2019    Preferred Times: Monday Morning, Tuesday Morning, Wednesday Morning, Thursday Morning, Friday Morning    Reason for visit: Office Visit    Comments:  I think I have another yeast infection

## 2019-11-22 NOTE — Telephone Encounter (Signed)
Left message to call Aneyah Lortz, RN at [facility] [phone].   

## 2019-11-29 NOTE — Telephone Encounter (Signed)
Spoke with patient. Patient reports thick, white vaginal d/c with odor for approximately 1 wk. Denies urinary symptoms, pelvic pain, irregular bleeding, fever/chills. Hx of BV.  OV scheduled for 8/3 at 10am with Dr. Oscar La. Patient declined earlier appts offered sue to her work schedule. Advised patient to return call to office if any new symptoms develop or symptoms worsen.  Routing to provider for final review. Patient is agreeable to disposition. Will close encounter.  Last AEX 12/07/17

## 2019-11-29 NOTE — Telephone Encounter (Signed)
Left message to call Nicholette Dolson, RN at [facility] [phone].   

## 2019-12-05 ENCOUNTER — Other Ambulatory Visit: Payer: Self-pay

## 2019-12-05 ENCOUNTER — Encounter: Payer: Self-pay | Admitting: Obstetrics and Gynecology

## 2019-12-05 ENCOUNTER — Ambulatory Visit: Payer: BC Managed Care – PPO | Admitting: Obstetrics and Gynecology

## 2019-12-05 VITALS — BP 110/64 | HR 73 | Ht 63.0 in | Wt 149.0 lb

## 2019-12-05 DIAGNOSIS — N760 Acute vaginitis: Secondary | ICD-10-CM | POA: Diagnosis not present

## 2019-12-05 DIAGNOSIS — B9689 Other specified bacterial agents as the cause of diseases classified elsewhere: Secondary | ICD-10-CM

## 2019-12-05 DIAGNOSIS — Z113 Encounter for screening for infections with a predominantly sexual mode of transmission: Secondary | ICD-10-CM | POA: Diagnosis not present

## 2019-12-05 MED ORDER — FLUCONAZOLE 150 MG PO TABS: 150.0000 mg | ORAL_TABLET | Freq: Once | ORAL | 0 refills | Status: AC

## 2019-12-05 MED ORDER — METRONIDAZOLE 500 MG PO TABS: 500.0000 mg | ORAL_TABLET | Freq: Two times a day (BID) | ORAL | 0 refills | Status: DC

## 2019-12-05 NOTE — Progress Notes (Signed)
GYNECOLOGY  VISIT   HPI: 44 y.o.   Domestic Partner White or Caucasian Not Hispanic or Latino  female   [phone] with No LMP recorded. (Menstrual status: IUD).   here for vaginal discharge that is thick and chunky. She states she also has a slight vaginal odor. Symptoms started a week ago. No itching, burning or irritation. The smell is worse after sex. She wants STD testing to be safe, doesn't think her partner is cheating.   GYNECOLOGIC HISTORY: No LMP recorded. (Menstrual status: IUD). Contraception:IUD Menopausal hormone therapy: none         OB History    Gravida  4   Para  3   Term  3   Preterm  0   AB  1   Living  3     SAB      TAB      Ectopic      Multiple      Live Births  3              Patient Active Problem List   Diagnosis Date Noted   Vitamin D insufficiency 03/18/2018   Healthcare maintenance 11/29/2017   Crying with unclear etiology 11/29/2017   Screening examination for STD (sexually transmitted disease) 11/29/2017    Past Medical History:  Diagnosis Date   Anemia    with pregnancy   Blood transfusion without reported diagnosis 1998, 2000, 2008   History of gallstones    PONV (postoperative nausea and vomiting)     Past Surgical History:  Procedure Laterality Date   CESAREAN SECTION     x3   CHOLECYSTECTOMY     INTRAUTERINE DEVICE (IUD) INSERTION N/A 01/17/2018   Procedure: INTRAUTERINE DEVICE (IUD) INSERTION with ultrasound guidance;  Surgeon: Romualdo Bolk, MD;  Location: [facility] [place];  Service: Gynecology;  Laterality: N/A;  Mirena IUD insertion under ultrasound guidance.    Current Outpatient Medications  Medication Sig Dispense Refill   ibuprofen (ADVIL,MOTRIN) 600 MG tablet Take 600 mg by mouth every 6 (six) hours as needed.     levonorgestrel (MIRENA, 52 MG,) 20 MCG/24HR IUD Dispense one device for physician insertion in OR. 1 each 0   nitrofurantoin, macrocrystal-monohydrate, (MACROBID)  100 MG capsule Take one capsule after sexual activity. If urinary symptoms occur start medication twice daily and notify your provider. Do not start this medication until you complete your current prescription for urinary infection. 30 capsule 0   No current facility-administered medications for this visit.     ALLERGIES: Patient has no known allergies.  Family History  Problem Relation Age of Onset   Stroke Mother     Social History   Socioeconomic History   Marital status: Media planner    Spouse name: Not on file   Number of children: Not on file   Years of education: Not on file   Highest education level: Not on file  Occupational History   Not on file  Tobacco Use   Smoking status: Former Smoker    Packs/day: 1.00    Years: 19.00    Pack years: 19.00    Types: Cigarettes   Smokeless tobacco: Never Used  Building services engineer Use: Never used  Substance and Sexual Activity   Alcohol use: No   Drug use: No   Sexual activity: Yes    Birth control/protection: I.U.D.    Comment: Mirena  Other Topics Concern   Not on file  Social History Narrative  Not on file   Social Determinants of Health   Financial Resource Strain:    Difficulty of Paying Living Expenses:   Food Insecurity:    Worried About Programme researcher, broadcasting/film/video in the Last Year:    Barista in the Last Year:   Transportation Needs:    Freight forwarder (Medical):    Lack of Transportation (Non-Medical):   Physical Activity:    Days of Exercise per Week:    Minutes of Exercise per Session:   Stress:    Feeling of Stress :   Social Connections:    Frequency of Communication with Friends and Family:    Frequency of Social Gatherings with Friends and Family:    Attends Religious Services:    Active Member of Clubs or Organizations:    Attends Engineer, structural:    Marital Status:   Intimate Partner Violence:    Fear of Current or Ex-Partner:     Emotionally Abused:    Physically Abused:    Sexually Abused:     Review of Systems  Genitourinary:       Vaginal discharge  Vaginal odor   All other systems reviewed and are negative.   PHYSICAL EXAMINATION:    BP 110/64    Pulse 73    Ht 5\' 3"  (1.6 m)    Wt 149 lb (67.6 kg)    SpO2 98%    BMI 26.39 kg/m     General appearance: alert, cooperative and appears stated age  Pelvic: External genitalia:  no lesions              Urethra:  normal appearing urethra with no masses, tenderness or lesions              Bartholins and Skenes: normal                 Vagina: normal appearing vagina with normal color and discharge, no lesions              Cervix: no lesions, IUD strings 3-4 cm  Chaperone was present for exam.  Wet prep: + clue, no trich, rare wbc KOH: no yeast PH: 4.5   ASSESSMENT Bacterial vaginitis Screening STD    PLAN Flagyl Diflucan for prophylaxis (patient request) STD testing

## 2019-12-05 NOTE — Patient Instructions (Signed)
Vaginitis Vaginitis is a condition in which the vaginal tissue swells and becomes red (inflamed). This condition is most often caused by a change in the normal balance of bacteria and yeast that live in the vagina. This change causes an overgrowth of certain bacteria or yeast, which causes the inflammation. There are different types of vaginitis, but the most common types are:  Bacterial vaginosis.  Yeast infection (candidiasis).  Trichomoniasis vaginitis. This is a sexually transmitted disease (STD).  Viral vaginitis.  Atrophic vaginitis.  Allergic vaginitis. What are the causes? The cause of this condition depends on the type of vaginitis. It can be caused by:  Bacteria (bacterial vaginosis).  Yeast, which is a fungus (yeast infection).  A parasite (trichomoniasis vaginitis).  A virus (viral vaginitis).  Low hormone levels (atrophic vaginitis). Low hormone levels can occur during pregnancy, breastfeeding, or after menopause.  Irritants, such as bubble baths, scented tampons, and feminine sprays (allergic vaginitis). Other factors can change the normal balance of the yeast and bacteria that live in the vagina. These include:  Antibiotic medicines.  Poor hygiene.  Diaphragms, vaginal sponges, spermicides, birth control pills, and intrauterine devices (IUD).  Sex.  Infection.  Uncontrolled diabetes.  A weakened defense (immune) system. What increases the risk? This condition is more likely to develop in women who:  Smoke.  Use vaginal douches, scented tampons, or scented sanitary pads.  Wear tight-fitting pants.  Wear thong underwear.  Use oral birth control pills or an IUD.  Have sex without a condom.  Have multiple sex partners.  Have an STD.  Frequently use the spermicide nonoxynol-9.  Eat lots of foods high in sugar.  Have uncontrolled diabetes.  Have low estrogen levels.  Have a weakened immune system from an immune disorder or medical  treatment.  Are pregnant or breastfeeding. What are the signs or symptoms? Symptoms vary depending on the cause of the vaginitis. Common symptoms include:  Abnormal vaginal discharge. ? The discharge is white, gray, or yellow with bacterial vaginosis. ? The discharge is thick, white, and cheesy with a yeast infection. ? The discharge is frothy and yellow or greenish with trichomoniasis.  A bad vaginal smell. The smell is fishy with bacterial vaginosis.  Vaginal itching, pain, or swelling.  Sex that is painful.  Pain or burning when urinating. Sometimes there are no symptoms. How is this diagnosed? This condition is diagnosed based on your symptoms and medical history. A physical exam, including a pelvic exam, will also be done. You may also have other tests, including:  Tests to determine the pH level (acidity or alkalinity) of your vagina.  A whiff test, to assess the odor that results when a sample of your vaginal discharge is mixed with a potassium hydroxide solution.  Tests of vaginal fluid. A sample will be examined under a microscope. How is this treated? Treatment varies depending on the type of vaginitis you have. Your treatment may include:  Antibiotic creams or pills to treat bacterial vaginosis and trichomoniasis.  Antifungal medicines, such as vaginal creams or suppositories, to treat a yeast infection.  Medicine to ease discomfort if you have viral vaginitis. Your sexual partner should also be treated.  Estrogen delivered in a cream, pill, suppository, or vaginal ring to treat atrophic vaginitis. If vaginal dryness occurs, lubricants and moisturizing creams may help. You may need to avoid scented soaps, sprays, or douches.  Stopping use of a product that is causing allergic vaginitis. Then using a vaginal cream to treat the symptoms. Follow   these instructions at home: Lifestyle  Keep your genital area clean and dry. Avoid soap, and only rinse the area with  water.  Do not douche or use tampons until your health care provider says it is okay to do so. Use sanitary pads, if needed.  Do not have sex until your health care provider approves. When you can return to sex, practice safe sex and use condoms.  Wipe from front to back. This avoids the spread of bacteria from the rectum to the vagina. General instructions  Take over-the-counter and prescription medicines only as told by your health care provider.  If you were prescribed an antibiotic medicine, take or use it as told by your health care provider. Do not stop taking or using the antibiotic even if you start to feel better.  Keep all follow-up visits as told by your health care provider. This is important. How is this prevented?  Use mild, non-scented products. Do not use things that can irritate the vagina, such as fabric softeners. Avoid the following products if they are scented: ? Feminine sprays. ? Detergents. ? Tampons. ? Feminine hygiene products. ? Soaps or bubble baths.  Let air reach your genital area. ? Wear cotton underwear to reduce moisture buildup. ? Avoid wearing underwear while you sleep. ? Avoid wearing tight pants and underwear or nylons without a cotton panel. ? Avoid wearing thong underwear.  Take off any wet clothing, such as bathing suits, as soon as possible.  Practice safe sex and use condoms. Contact a health care provider if:  You have abdominal pain.  You have a fever.  You have symptoms that last for more than 2-3 days. Get help right away if:  You have a fever and your symptoms suddenly get worse. Summary  Vaginitis is a condition in which the vaginal tissue becomes inflamed.This condition is most often caused by a change in the normal balance of bacteria and yeast that live in the vagina.  Treatment varies depending on the type of vaginitis you have.  Do not douche, use tampons , or have sex until your health care provider approves. When  you can return to sex, practice safe sex and use condoms. This information is not intended to replace advice given to you by your health care provider. Make sure you discuss any questions you have with your health care provider. Document Revised: 04/02/2017 Document Reviewed: 05/26/2016 Elsevier Patient Education  2020 Elsevier Inc.  

## 2019-12-06 LAB — CHLAMYDIA/GONOCOCCUS/TRICHOMONAS, NAA
Chlamydia by NAA: NEGATIVE
Gonococcus by NAA: NEGATIVE
Trich vag by NAA: NEGATIVE

## 2019-12-06 LAB — RPR: RPR Ser Ql: NONREACTIVE

## 2019-12-06 LAB — HIV ANTIBODY (ROUTINE TESTING W REFLEX): HIV Screen 4th Generation wRfx: NONREACTIVE

## 2020-01-11 ENCOUNTER — Other Ambulatory Visit: Payer: Self-pay

## 2020-01-11 ENCOUNTER — Other Ambulatory Visit: Payer: BC Managed Care – PPO

## 2020-01-11 DIAGNOSIS — Z20822 Contact with and (suspected) exposure to covid-19: Secondary | ICD-10-CM

## 2020-01-13 LAB — NOVEL CORONAVIRUS, NAA: SARS-CoV-2, NAA: NOT DETECTED

## 2020-01-13 LAB — SARS-COV-2, NAA 2 DAY TAT

## 2020-01-18 ENCOUNTER — Other Ambulatory Visit: Payer: BC Managed Care – PPO

## 2022-04-14 ENCOUNTER — Other Ambulatory Visit: Payer: Self-pay | Admitting: General Surgery

## 2022-06-26 ENCOUNTER — Ambulatory Visit: Payer: BC Managed Care – PPO | Admitting: Obstetrics and Gynecology

## 2022-12-14 ENCOUNTER — Emergency Department (HOSPITAL_COMMUNITY): Payer: BC Managed Care – PPO

## 2022-12-14 ENCOUNTER — Emergency Department (HOSPITAL_COMMUNITY)
Admission: EM | Admit: 2022-12-14 | Discharge: 2022-12-14 | Disposition: A | Discharge status: Home / Self Care | Payer: BC Managed Care – PPO | Attending: Emergency Medicine | Admitting: Emergency Medicine

## 2022-12-14 DIAGNOSIS — R079 Chest pain, unspecified: Secondary | ICD-10-CM | POA: Diagnosis present

## 2022-12-14 DIAGNOSIS — R0789 Other chest pain: Secondary | ICD-10-CM | POA: Diagnosis not present

## 2022-12-14 DIAGNOSIS — R1030 Lower abdominal pain, unspecified: Secondary | ICD-10-CM | POA: Insufficient documentation

## 2022-12-14 DIAGNOSIS — Y92410 Unspecified street and highway as the place of occurrence of the external cause: Secondary | ICD-10-CM | POA: Diagnosis not present

## 2022-12-14 DIAGNOSIS — N8301 Follicular cyst of right ovary: Secondary | ICD-10-CM

## 2022-12-14 DIAGNOSIS — E041 Nontoxic single thyroid nodule: Secondary | ICD-10-CM

## 2022-12-14 DIAGNOSIS — K5790 Diverticulosis of intestine, part unspecified, without perforation or abscess without bleeding: Secondary | ICD-10-CM

## 2022-12-14 DIAGNOSIS — S301XXA Contusion of abdominal wall, initial encounter: Secondary | ICD-10-CM

## 2022-12-14 LAB — CBC
HCT: 39.6 % (ref 36.0–46.0)
Hemoglobin: 13.2 g/dL (ref 12.0–15.0)
MCH: 28.6 pg (ref 26.0–34.0)
MCHC: 33.3 g/dL (ref 30.0–36.0)
MCV: 85.7 fL (ref 80.0–100.0)
Platelets: 263 10*3/uL (ref 150–400)
RBC: 4.62 MIL/uL (ref 3.87–5.11)
RDW: 12.4 % (ref 11.5–15.5)
WBC: 7 10*3/uL (ref 4.0–10.5)
nRBC: 0 % (ref 0.0–0.2)

## 2022-12-14 LAB — BASIC METABOLIC PANEL
Anion gap: 11 (ref 5–15)
BUN: 14 mg/dL (ref 6–20)
CO2: 21 mmol/L — ABNORMAL LOW (ref 22–32)
Calcium: 8.9 mg/dL (ref 8.9–10.3)
Chloride: 103 mmol/L (ref 98–111)
Creatinine, Ser: 0.93 mg/dL (ref 0.44–1.00)
GFR, Estimated: >60 mL/min (ref >60)
Glucose, Bld: 109 mg/dL — ABNORMAL HIGH (ref 70–99)
Potassium: 4.2 mmol/L (ref 3.5–5.1)
Sodium: 135 mmol/L (ref 135–145)

## 2022-12-14 LAB — HCG, SERUM, QUALITATIVE: Preg, Serum: NEGATIVE

## 2022-12-14 MED ORDER — IOHEXOL 350 MG/ML SOLN
75.0000 mL | Freq: Once | INTRAVENOUS | Status: AC | PRN
  Administered 2022-12-14: 75 mL via INTRAVENOUS

## 2022-12-14 MED ORDER — SODIUM CHLORIDE 0.9 % IV BOLUS
1000.0000 mL | Freq: Once | INTRAVENOUS | Status: AC
  Administered 2022-12-14: 1000 mL via INTRAVENOUS

## 2022-12-14 MED ORDER — IBUPROFEN 800 MG PO TABS: 800.0000 mg | ORAL_TABLET | Freq: Three times a day (TID) | ORAL | 0 refills | Status: AC | PRN

## 2022-12-14 MED ORDER — CYCLOBENZAPRINE HCL 10 MG PO TABS
10.0000 mg | ORAL_TABLET | Freq: Two times a day (BID) | ORAL | 0 refills | Status: DC | PRN
Start: 2022-12-14 — End: 2022-12-25

## 2022-12-14 MED ORDER — ONDANSETRON HCL 4 MG/2ML IJ SOLN
4.0000 mg | Freq: Once | INTRAMUSCULAR | Status: AC
  Administered 2022-12-14: 4 mg via INTRAVENOUS
  Filled 2022-12-14: qty 2

## 2022-12-14 MED ORDER — KETOROLAC TROMETHAMINE 30 MG/ML IJ SOLN
30.0000 mg | Freq: Once | INTRAMUSCULAR | Status: AC
  Administered 2022-12-14: 30 mg via INTRAVENOUS
  Filled 2022-12-14: qty 1

## 2022-12-14 MED ORDER — ACETAMINOPHEN 325 MG PO TABS
650.0000 mg | ORAL_TABLET | Freq: Four times a day (QID) | ORAL | 0 refills | Status: AC | PRN
Start: 2022-12-14 — End: ?
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
## 2022-12-14 NOTE — ED Notes (Signed)
RN was talking to pt when pt suddenly reported a rush of dizziness and stated, "I feel like I am going to pass out." Remains alert and oriented x 4 . Denies blurred vision. Dr.Pickering notified. Cardiac monitoring in place. 1L NS initiated.
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
## 2022-12-14 NOTE — Discharge Instructions (Signed)
I included information for you to read over about the incidental findings on your CT scan.  This includes a thyroid nodule, cyst on your right ovary, as well as diverticulosis.  Most importantly you will need follow-up with a thyroid nodule with your primary care clinic.  You may need an ultrasound of her thyroid to be performed.  *  You will likely be very sore for the next several days, including tomorrow when you wake up.  I prescribed you muscle relaxers to help with the muscle soreness, as well as ibuprofen and Tylenol.  Please continue to drink plenty of water to keep your muscles hydrated.
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
## 2022-12-17 ENCOUNTER — Telehealth: Payer: Self-pay | Admitting: *Deleted

## 2022-12-17 NOTE — Telephone Encounter (Signed)
Pt left message requesting a f/u appointment however pt is no longer established at this location.  She would need to re-establish. Seen in office 02/01/2018.

## 2022-12-25 ENCOUNTER — Telehealth: Payer: BC Managed Care – PPO | Admitting: Physician Assistant

## 2022-12-25 DIAGNOSIS — S060X0A Concussion without loss of consciousness, initial encounter: Secondary | ICD-10-CM | POA: Diagnosis not present

## 2022-12-25 DIAGNOSIS — M542 Cervicalgia: Secondary | ICD-10-CM | POA: Diagnosis not present

## 2022-12-25 MED ORDER — METHYLPREDNISOLONE 4 MG PO TBPK
ORAL_TABLET | ORAL | 0 refills | Status: DC
Start: 2022-12-25 — End: 2023-01-07

## 2022-12-25 MED ORDER — CYCLOBENZAPRINE HCL 10 MG PO TABS
5.0000 mg | ORAL_TABLET | Freq: Three times a day (TID) | ORAL | 0 refills | Status: AC | PRN
Start: 2022-12-25 — End: ?

## 2022-12-25 NOTE — Patient Instructions (Signed)
Dawn Russo, thank you for joining Margaretann Loveless, PA-C for today's virtual visit.  While this provider is not your primary care provider (PCP), if your PCP is located in our provider database this encounter information will be shared with them immediately following your visit.   A [facility] MyChart account gives you access to today's visit and all your visits, tests, and labs performed at [facility] " click here if you don't have a [facility] MyChart account or go to [email or web address]  Consent: (Patient) Dawn Russo provided verbal consent for this virtual visit at the beginning of the encounter.  Current Medications:  Current Outpatient Medications:    cyclobenzaprine (FLEXERIL) 10 MG tablet, Take 0.5-1 tablets (5-10 mg total) by mouth 3 (three) times daily as needed., Disp: 30 tablet, Rfl: 0   methylPREDNISolone (MEDROL DOSEPAK) 4 MG TBPK tablet, 6 day taper; take as directed on package instructions, Disp: 21 tablet, Rfl: 0   acetaminophen (TYLENOL) 325 MG tablet, Take 2 tablets (650 mg total) by mouth every 6 (six) hours as needed for up to 30 doses for moderate pain or mild pain., Disp: 30 tablet, Rfl: 0   ibuprofen (ADVIL) 800 MG tablet, Take 1 tablet (800 mg total) by mouth every 8 (eight) hours as needed for up to 30 doses., Disp: 30 tablet, Rfl: 0   ibuprofen (ADVIL,MOTRIN) 600 MG tablet, Take 600 mg by mouth every 6 (six) hours as needed., Disp: , Rfl:    levonorgestrel (MIRENA, 52 MG,) 20 MCG/24HR IUD, Dispense one device for physician insertion in OR., Disp: 1 each, Rfl: 0   metroNIDAZOLE (FLAGYL) 500 MG tablet, Take 1 tablet (500 mg total) by mouth 2 (two) times daily., Disp: 14 tablet, Rfl: 0   nitrofurantoin, macrocrystal-monohydrate, (MACROBID) 100 MG capsule, Take one capsule after sexual activity. If urinary symptoms occur start medication twice daily and notify your provider. Do not start this medication until you complete your current  prescription for urinary infection., Disp: 30 capsule, Rfl: 0   Medications ordered in this encounter:  Meds ordered this encounter  Medications   cyclobenzaprine (FLEXERIL) 10 MG tablet    Sig: Take 0.5-1 tablets (5-10 mg total) by mouth 3 (three) times daily as needed.    Dispense:  30 tablet    Refill:  0    Order Specific Question:   Supervising Provider    Answer:   Merrilee Jansky [[id number]]   methylPREDNISolone (MEDROL DOSEPAK) 4 MG TBPK tablet    Sig: 6 day taper; take as directed on package instructions    Dispense:  21 tablet    Refill:  0    Order Specific Question:   Supervising Provider    Answer:   Merrilee Jansky [[id number]]     *If you need refills on other medications prior to your next appointment, please contact your pharmacy*  Follow-Up: Call back or seek an in-person evaluation if the symptoms worsen or if the condition fails to improve as anticipated.   Virtual Care [phone]  Other Instructions  Cervical Sprain A cervical sprain is a stretch or tear in one or more of the ligaments in the neck. Ligaments are the tissues that connect bones to each other. Cervical sprains can range from mild to severe. Severe cervical sprains can cause the spinal bones (vertebrae) in the neck to be unstable. This can result in spinal cord damage and serious nervous system problems. Healing time for a cervical sprain depends on the cause and extent  of the injury. Most cervical sprains heal in 4-6 weeks. What are the causes? Cervical sprains may be caused by trauma, such as an injury from a motor vehicle accident, a fall, or a sudden forward and backward whipping movement of the head and neck (whiplash injury). Mild cervical sprains may be caused by wear and tear over time. What increases the risk? You are more likely to get a cervical sprain if: You take part in activities that have a high risk of trauma to the neck. These include contact sports, gymnastics, and  diving. You have: Osteoarthritis of the spine. Poor strength and flexibility of the neck. Poor posture. You have had a neck injury in the past. You spend long periods in positions that put stress on the neck, such as sitting at a computer. What are the signs or symptoms? Symptoms of this condition include: Any of these problems in the neck, shoulders, or upper back: Pain or tenderness. Stiffness. Swelling. A burning feeling. Sudden tightening of neck muscles (spasms). Limited ability to move the neck. Headache. Dizziness. Nausea or vomiting. Weakness, numbness, or tingling in a hand or an arm. Symptoms may develop right away after injury or may develop over a few days. In some cases, symptoms may go away with treatment and return (recur) over time. How is this diagnosed? This condition may be diagnosed based on: Your symptoms, medical history, and a physical exam. Any recent injuries or known neck problems that you have, such as arthritis in the neck. Imaging tests, such as X-rays, an MRI, or a CT scan. How is this treated? This condition is treated by resting and icing the injured area and doing physical therapy exercises to improve movement and strength. Heat therapy may be used 2-3 days after the injury if there is no swelling. Depending on the severity of your condition, treatment may also include: Keeping your neck in place (immobilized) for periods of time. This may be done using: A cervical collar. This supports your chin and the back of your head. A cervical traction device. This is a sling that holds up your head. It removes weight and pressure from your neck. Medicines for pain or other symptoms. Surgery. This is rare. Follow these instructions at home: Medicines Take over-the-counter and prescription medicines only as told by your health care provider. Ask your provider if the medicine prescribed to you: Requires you to avoid driving or using machinery. Can cause  constipation. You may need to take these actions to prevent or treat constipation: Drink enough fluid to keep your pee pale yellow. Take over-the-counter or prescription medicines. Eat foods that are high in fiber, such as beans, whole grains, and fresh fruits and vegetables. Limit foods that are high in fat and processed sugars, such as fried or sweet foods. If you have a cervical collar: Wear the collar as told by your provider. Do not remove it unless told. Ask before making any adjustments to your collar. If you have long hair, keep it outside of the collar. If you are allowed to remove the collar for cleaning and bathing: Follow instructions about how to remove it safely. Clean it by hand with mild soap and water and air-dry it completely. If your collar has removable pads, remove them every 1-2 days and wash them by hand with soap and water. Let them air-dry completely before putting them back in the collar. Tell your provider if your skin under the collar has irritation or sores. Managing pain, stiffness, and swelling  Use a cervical traction device as told. If told, put ice on the affected area. Put ice in a plastic bag. Place a towel between your skin and the bag. Leave the ice on for 20 minutes, 2-3 times a day. If told, apply heat to the affected area before you exercise or as often as told by your provider. Use the heat source that your provider recommends, such as a moist heat pack or a heating pad. Place a towel between your skin and the heat source. Leave the heat on for 20-30 minutes. If your skin turns bright red, remove the ice or heat right away to prevent skin damage. The risk of damage is higher if you cannot feel pain, heat, or cold. Activity Do not drive while wearing a cervical collar. If you do not have a cervical collar, ask if it is safe to drive while your neck heals. Do not lift anything that is heavier than 10 lb (4.5 kg) until your provider says that it  is safe. Rest as told by your provider. Avoid positions and activities that make your symptoms worse. Do physical therapy exercises as told by your provider or physical therapist. Return to your normal activities as told by your provider. Ask your provider what activities are safe for you. General instructions Do not use any products that contain nicotine or tobacco. These products include cigarettes, chewing tobacco, and vaping devices, such as e-cigarettes. These can delay healing. If you need help quitting, ask your provider. Keep all follow-up visits. Your provider will monitor your injury and activity level. How is this prevented? To prevent a cervical sprain from happening again: Use and maintain good posture. Make any needed adjustments to your workstation to help you do this. Exercise regularly as told by your provider or physical therapist. Avoid risky activities that may cause a cervical sprain. Contact a health care provider if: You have symptoms that get worse or do not get better after 2 weeks of treatment. You have new symptoms. Your pain gets worse or does not get better with medicine. You have sores or irritated skin on your neck from wearing your cervical collar. Get help right away if: You have severe pain. You develop numbness, tingling, or weakness in any part of your body. You cannot move a part of your body (you have paralysis). You have neck pain along with severe dizziness or headache. This information is not intended to replace advice given to you by your health care provider. Make sure you discuss any questions you have with your health care provider. Document Revised: 11/21/2021 Document Reviewed: 11/21/2021 Elsevier Patient Education  2024 [employer]   Cervical Strain and Sprain Rehab Ask your health care provider which exercises are safe for you. Do exercises exactly as told by your health care provider and adjust them as directed. It is normal to feel  mild stretching, pulling, tightness, or discomfort as you do these exercises. Stop right away if you feel sudden pain or your pain gets worse. Do not begin these exercises until told by your health care provider. Stretching and range-of-motion exercises Cervical side bending  Using good posture, sit on a stable chair or stand up. Without moving your shoulders, slowly tilt your left / right ear to your shoulder until you feel a stretch in the neck muscles on the opposite side. You should be looking straight ahead. Hold for __________ seconds. Repeat with the other side of your neck. Repeat __________ times. Complete this exercise __________ times  a day. Cervical rotation  Using good posture, sit on a stable chair or stand up. Slowly turn your head to the side as if you are looking over your left / right shoulder. Keep your eyes level with the ground. Stop when you feel a stretch along the side and the back of your neck. Hold for __________ seconds. Repeat this by turning to your other side. Repeat __________ times. Complete this exercise __________ times a day. Thoracic extension and pectoral stretch  Roll a towel or a small blanket so it is about 4 inches (10 cm) in diameter. Lie down on your back on a firm surface. Put the towel in the middle of your back across your spine. It should not be under your shoulder blades. Put your hands behind your head and let your elbows fall out to your sides. Hold for __________ seconds. Repeat __________ times. Complete this exercise __________ times a day. Strengthening exercises Upper cervical flexion  Lie on your back with a thin pillow behind your head or a small, rolled-up towel under your neck. Gently tuck your chin toward your chest and nod your head down to look toward your feet. Do not lift your head off the pillow. Hold for __________ seconds. Release the tension slowly. Relax your neck muscles completely before you repeat this  exercise. Repeat __________ times. Complete this exercise __________ times a day. Cervical extension  Stand about 6 inches (15 cm) away from a wall, with your back facing the wall. Place a soft object, about 6-8 inches (15-20 cm) in diameter, between the back of your head and the wall. A soft object could be a small pillow, a ball, or a folded towel. Gently tilt your head back and press into the soft object. Keep your jaw and forehead relaxed. Hold for __________ seconds. Release the tension slowly. Relax your neck muscles completely before you repeat this exercise. Repeat __________ times. Complete this exercise __________ times a day. Posture and body mechanics Body mechanics refer to the movements and positions of your body while you do your daily activities. Posture is part of body mechanics. Good posture and healthy body mechanics can help to relieve stress in your body's tissues and joints. Good posture means that your spine is in its natural S-curve position (your spine is neutral), your shoulders are pulled back slightly, and your head is not tipped forward. The following are general guidelines for using improved posture and body mechanics in your everyday activities. Sitting  When sitting, keep your spine neutral and keep your feet flat on the floor. Use a footrest, if needed, and keep your thighs parallel to the floor. Avoid rounding your shoulders. Avoid tilting your head forward. When working at a desk or a computer, keep your desk at a height where your hands are slightly lower than your elbows. Slide your chair under your desk so you are close enough to maintain good posture. When working at a computer, place your monitor at a height where you are looking straight ahead and you do not have to tilt your head forward or downward to look at the screen. Standing  When standing, keep your spine neutral and keep your feet about hip-width apart. Keep a slight bend in your knees. Your ears,  shoulders, and hips should line up. When you do a task in which you stand in one place for a long time, place one foot up on a stable object that is 2-4 inches (5-10 cm) high, such as a  footstool. This helps keep your spine neutral. Resting When lying down and resting, avoid positions that are most painful for you. Try to support your neck in a neutral position. You can use a contour pillow or a small rolled-up towel. Your pillow should support your neck but not push on it. This information is not intended to replace advice given to you by your health care provider. Make sure you discuss any questions you have with your health care provider. Document Revised: 08/24/2022 Document Reviewed: 11/10/2021 Elsevier Patient Education  2024 [employer].   Concussion, Adult  A concussion is a brain injury from a hard, direct hit (trauma) to the head or body. This direct hit causes the brain to shake quickly back and forth inside the skull. This can damage brain cells and cause chemical changes in the brain. A concussion may also be known as a mild traumatic brain injury (TBI). The effects of a concussion can be serious. If you have a concussion, you should be very careful to avoid having a second concussion. What are the causes? This condition is caused by: A direct hit to your head. Sudden movement of your body that causes your brain to move back and forth inside the skull, such as in a car crash. What are the signs or symptoms? The signs of a concussion can be hard to notice. Early on, they may be missed by you, family members, and health care providers. You may look fine on the outside but may act or feel differently. Every head injury is different. Symptoms are usually temporary but may last for days, weeks, or even months. Some symptoms appear right away, but other symptoms may not show up for hours or days. Physical symptoms Headaches. Dizziness and problems with coordination or  balance. Sensitivity to light or noise. Nausea or vomiting. Tiredness (fatigue). Vision or hearing problems. Seizure. Mental and emotional symptoms Irritability or mood changes. Memory problems. Trouble concentrating, organizing, or making decisions. Changes in eating or sleeping patterns. Slowness in thinking, acting or reacting, speaking, or reading. Anxiety or depression. How is this diagnosed? This condition is diagnosed based on your symptoms and injury. You may also have tests, including: Imaging tests, such as a CT scan or an MRI. Neuropsychological tests. These measure your thinking, understanding, learning, and memory. How is this treated? Treatment for this condition includes: Stopping sports or activity if you are injured. Physical and mental rest and careful observation, usually at home. Medicines to help with symptoms such as headaches, nausea, or difficulty sleeping. Referral to a concussion clinic or rehab center. Follow these instructions at home: Activity Limit activities that require a lot of thought or concentration, such as: Doing homework or job-related work. Watching TV. Using the computer or phone. Playing memory games and doing puzzles. Rest helps your brain heal. Make sure you: Get plenty of sleep. Most adults should get 7-9 hours of sleep each night. Rest during the day. Take naps or rest breaks when you feel tired. Avoid high-intensity exercise or physical activities that take a lot of effort. Stop any activity that worsens symptoms. Your health care provider may recommend light exercise such as walking. Do not do high-risk activities that could cause a second concussion, such as riding a bike or playing sports. Ask your health care provider when you can return to your normal activities, such as school, work, sports, and driving. Your ability to react may be slower after a brain injury. Never do these activities if you are dizzy.  General  instructions  Take over-the-counter and prescription medicines only as told by your health care provider. Some medicines, such as blood thinners (anticoagulants) and aspirin, may increase the risk for complications, such as bleeding. Avoid taking opioid pain medicine while recovering from a concussion. Do not drink alcohol until your health care provider says you can. Drinking alcohol may slow your recovery and can put you at risk of further injury. Watch your symptoms and tell others around you to do the same. Complications sometimes occur after a concussion. Tell your work Production designer, theatre/television/film, teachers, Tax adviser, school counselor, coach, or sports trainer about your injury, symptoms, and restrictions. See a mental health therapist if you feel anxious or depressed. Managing this condition can be challenging. Keep all follow-up visits. Your health care provider will check on your recovery and give you a plan for returning to activities. How is this prevented? Avoiding another brain injury is very important. In rare cases, another injury can lead to permanent brain damage, brain swelling, or death. The risk of this is greatest during the first 7-10 days after a head injury. Avoid injuries by: Stopping activities that could lead to a second concussion, such as contact or recreational sports, until your health care provider says it is okay. Taking these actions once you have returned to sports or activities: Avoid plays or moves that can cause you to crash into another person. This is how most concussions occur. Follow the rules and be respectful of other players. Do not engage in violent or illegal plays. Getting regular exercise that includes strength and balance training. Wearing a properly fitting helmet during sports, biking, or other activities. Helmets can help protect you from serious skull and brain injuries, but they may not protect you from a concussion. Even when wearing a helmet, you should avoid  being hit in the head. Where to find more information Centers for Disease Control and Prevention: TonerPromos.no Contact a health care provider if: Your symptoms do not improve or get worse. You have new symptoms. You have another injury. Your coordination gets worse. You have unusual behavior changes. Get help right away if: You have a severe or worsening headache. You have weakness or numbness in any part of your body, slurred speech, vision changes, or confusion. You vomit repeatedly. You lose consciousness, are sleepier than normal, or are difficult to wake up. You have a seizure. These symptoms may be an emergency. Get help right away. Call 911. Do not wait to see if the symptoms will go away. Do not drive yourself to the hospital. Also, get help right away if: You have thoughts of hurting yourself or others. Take one of these steps if you feel like you may hurt yourself or others, or have thoughts about taking your own life: Go to your nearest emergency room. Call 911. Call the National Suicide Prevention Lifeline at 215-820-0310 or 988. This is open 24 hours a day. Text the Crisis Text Line at (903)537-3121. This information is not intended to replace advice given to you by your health care provider. Make sure you discuss any questions you have with your health care provider. Document Revised: 09/12/2021 Document Reviewed: 09/12/2021 Elsevier Patient Education  2024 [employer] Inc.    If you have been instructed to have an in-person evaluation today at a local Urgent Care facility, please use the link below. It will take you to a list of all of our available [facility], including address, phone number and hours of operation. Please  do not delay care.  [facility]  If you or a family member do not have a primary care provider, use the link below to schedule a visit and establish care. When you choose a [facility] primary care physician or advanced practice  provider, you gain a long-term partner in health. Find a Primary Care Provider  Learn more about [facility]'s in-office and virtual care options: [facility] - Get Care Now
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
## 2023-01-07 ENCOUNTER — Ambulatory Visit: Payer: BC Managed Care – PPO | Admitting: Nurse Practitioner

## 2023-01-07 ENCOUNTER — Ambulatory Visit (INDEPENDENT_AMBULATORY_CARE_PROVIDER_SITE_OTHER)
Admission: RE | Admit: 2023-01-07 | Discharge: 2023-01-07 | Disposition: A | Discharge status: Home / Self Care | Payer: BC Managed Care – PPO | Source: Ambulatory Visit | Attending: Nurse Practitioner | Admitting: Nurse Practitioner

## 2023-01-07 ENCOUNTER — Encounter: Payer: Self-pay | Admitting: Nurse Practitioner

## 2023-01-07 VITALS — BP 100/78 | HR 106 | Temp 99.0°F | Ht 65.0 in | Wt 170.0 lb

## 2023-01-07 DIAGNOSIS — Z1231 Encounter for screening mammogram for malignant neoplasm of breast: Secondary | ICD-10-CM

## 2023-01-07 DIAGNOSIS — G44209 Tension-type headache, unspecified, not intractable: Secondary | ICD-10-CM

## 2023-01-07 DIAGNOSIS — F0781 Postconcussional syndrome: Secondary | ICD-10-CM

## 2023-01-07 DIAGNOSIS — M545 Low back pain, unspecified: Secondary | ICD-10-CM

## 2023-01-07 DIAGNOSIS — E041 Nontoxic single thyroid nodule: Secondary | ICD-10-CM | POA: Insufficient documentation

## 2023-01-07 DIAGNOSIS — Z7689 Persons encountering health services in other specified circumstances: Secondary | ICD-10-CM

## 2023-01-07 HISTORY — DX: Nontoxic single thyroid nodule: E04.1

## 2023-01-07 MED ORDER — BUTALBITAL-APAP-CAFFEINE 50-325-40 MG PO TABS
1.0000 | ORAL_TABLET | Freq: Two times a day (BID) | ORAL | 0 refills | Status: AC | PRN
Start: 2023-01-07 — End: ?
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
## 2023-01-07 NOTE — Assessment & Plan Note (Signed)
Incidental finding on CT scan emergency department.  Recommended dedicated ultrasound order placed today patient given information to call and set up at her convenience

## 2023-01-07 NOTE — Assessment & Plan Note (Signed)
Patient new to me.  Did do brief review of EMR did review all of patient's hospital information in regards to recent ED visit
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
## 2023-01-07 NOTE — Assessment & Plan Note (Signed)
Tension headache she is having trouble getting to break.  She has been using cyclobenzaprine 10 mg daily without great relief will try Fioricet 1 tab twice daily as needed not to take at the same time with a muscle relaxer

## 2023-01-07 NOTE — Patient Instructions (Addendum)
Nice to see you today I will be in touch with the xray once I have reviewed it Follow up with me in 4-5 months for your physical and full panel of labs, sooner if you need me  You can update me via mychart about how you are feeling and the medication is doing for the headaches

## 2023-01-07 NOTE — Assessment & Plan Note (Signed)
CT in Emergency Department showed some degenerative changes patient has some pinpoint tenderness on exam we will do a dedicated x-ray today.  Continue taking muscle relaxer as needed or beneficial.

## 2023-01-08 ENCOUNTER — Encounter: Payer: Self-pay | Admitting: *Deleted

## 2023-01-11 ENCOUNTER — Encounter: Payer: Self-pay | Admitting: Nurse Practitioner

## 2023-01-11 DIAGNOSIS — E041 Nontoxic single thyroid nodule: Secondary | ICD-10-CM

## 2023-01-25 ENCOUNTER — Other Ambulatory Visit: Payer: BC Managed Care – PPO

## 2023-01-27 ENCOUNTER — Ambulatory Visit
Admission: RE | Admit: 2023-01-27 | Discharge: 2023-01-27 | Disposition: A | Discharge status: Home / Self Care | Payer: BC Managed Care – PPO | Source: Ambulatory Visit | Attending: Nurse Practitioner

## 2023-01-27 DIAGNOSIS — E041 Nontoxic single thyroid nodule: Secondary | ICD-10-CM

## 2023-02-01 NOTE — Addendum Note (Signed)
Addended by: Eden Emms on: 02/01/2023 01:46 PM   Modules accepted: Orders

## 2023-02-25 ENCOUNTER — Ambulatory Visit: Payer: BC Managed Care – PPO

## 2023-03-29 ENCOUNTER — Other Ambulatory Visit (HOSPITAL_COMMUNITY)
Admission: RE | Admit: 2023-03-29 | Discharge: 2023-03-29 | Disposition: A | Discharge status: Home / Self Care | Payer: BC Managed Care – PPO | Source: Ambulatory Visit | Attending: Interventional Radiology | Admitting: Interventional Radiology

## 2023-03-29 ENCOUNTER — Ambulatory Visit
Admission: RE | Admit: 2023-03-29 | Discharge: 2023-03-29 | Disposition: A | Discharge status: Home / Self Care | Payer: BC Managed Care – PPO | Source: Ambulatory Visit | Attending: Nurse Practitioner

## 2023-03-29 ENCOUNTER — Ambulatory Visit
Admission: RE | Admit: 2023-03-29 | Discharge: 2023-03-29 | Disposition: A | Discharge status: Home / Self Care | Payer: BC Managed Care – PPO | Source: Ambulatory Visit | Attending: Nurse Practitioner | Admitting: Nurse Practitioner

## 2023-03-29 DIAGNOSIS — E041 Nontoxic single thyroid nodule: Secondary | ICD-10-CM | POA: Insufficient documentation

## 2023-03-29 DIAGNOSIS — Z1231 Encounter for screening mammogram for malignant neoplasm of breast: Secondary | ICD-10-CM

## 2023-03-29 NOTE — Procedures (Signed)
Interventional Radiology Procedure Note  Procedure: US guided thyroid FNA bx.  isthmic Complications: None EBL: None Recommendations:   - Routine wound care - Follow up pathology    Signed,  Gilmer Mor, DO

## 2023-03-30 LAB — CYTOLOGY - NON PAP

## 2023-04-20 ENCOUNTER — Encounter: Payer: Self-pay | Admitting: Nurse Practitioner

## 2023-05-10 ENCOUNTER — Ambulatory Visit: Payer: 59 | Admitting: Nurse Practitioner

## 2023-07-20 ENCOUNTER — Encounter: Payer: Self-pay | Admitting: Obstetrics and Gynecology

## 2023-07-20 ENCOUNTER — Ambulatory Visit: Payer: Self-pay | Admitting: Obstetrics and Gynecology

## 2023-07-20 VITALS — BP 120/80 | HR 77 | Temp 97.7°F | Wt 172.0 lb

## 2023-07-20 DIAGNOSIS — Z975 Presence of (intrauterine) contraceptive device: Secondary | ICD-10-CM | POA: Insufficient documentation

## 2023-07-20 NOTE — Progress Notes (Signed)
 48 y.o. [id number] female with Mirena IUD (inserted June 2019 under anesthesia 2/2 pain) here for contraceptive counseling. Significant Other, presents with partner.  No LMP recorded. (Menstrual status: IUD).   Pt states she had IUD insertion in 2019 and its has expired. She has since had unprotected sex.   GYN HISTORY: CDx3  OB History  Gravida Para Term Preterm AB Living  4 3 3  0 1 3  SAB IAB Ectopic Multiple Live Births      3    # Outcome Date GA Lbr Len/2nd Weight Sex Type Anes PTL Lv  4 AB           3 Term      CS-Unspec   LIV  2 Term      CS-Unspec   LIV  1 Term      CS-Unspec   LIV    Past Medical History:  Diagnosis Date   Anemia    with pregnancy   Anxiety 46   Blood transfusion without reported diagnosis 1998, 2000, 2008   Cancer [facility]) 2023   Skin melanoma   Depression 2021   GERD (gastroesophageal reflux disease) 5 years   History of gallstones    PONV (postoperative nausea and vomiting)    Thyroid nodule greater than or equal to 1.5 cm in diameter incidentally noted on imaging study 01/07/2023    Past Surgical History:  Procedure Laterality Date   CESAREAN SECTION     x3   CHOLECYSTECTOMY     INTRAUTERINE DEVICE (IUD) INSERTION N/A 01/17/2018   Procedure: INTRAUTERINE DEVICE (IUD) INSERTION with ultrasound guidance;  Surgeon: Romualdo Bolk, MD;  Location: [facility];  Service: Gynecology;  Laterality: N/A;  Mirena IUD insertion under ultrasound guidance.    Current Outpatient Medications on File Prior to Visit  Medication Sig Dispense Refill   acetaminophen (TYLENOL) 325 MG tablet Take 2 tablets (650 mg total) by mouth every 6 (six) hours as needed for up to 30 doses for moderate pain or mild pain. 30 tablet 0   fluticasone (CUTIVATE) 0.005 % ointment Apply topically.     ibuprofen (ADVIL) 800 MG tablet Take 1 tablet (800 mg total) by mouth every 8 (eight) hours as needed for up to 30 doses. 30 tablet 0   ibuprofen (ADVIL,MOTRIN)  600 MG tablet Take 600 mg by mouth every 6 (six) hours as needed.     levonorgestrel (MIRENA, 52 MG,) 20 MCG/24HR IUD Dispense one device for physician insertion in OR. 1 each 0   butalbital-acetaminophen-caffeine (FIORICET) 50-325-40 MG tablet Take 1 tablet by mouth 2 (two) times daily as needed for headache. (Patient not taking: Reported on 07/20/2023) 15 tablet 0   cyclobenzaprine (FLEXERIL) 10 MG tablet Take 0.5-1 tablets (5-10 mg total) by mouth 3 (three) times daily as needed. (Patient not taking: Reported on 07/20/2023) 30 tablet 0   No current facility-administered medications on file prior to visit.    No Known Allergies    PE Today's Vitals   07/20/23 1056  BP: 120/80  Pulse: 77  Temp: 97.7 F (36.5 C)  TempSrc: Oral  SpO2: 99%  Weight: 172 lb (78 kg)   Body mass index is 28.62 kg/m.  Physical Exam Vitals reviewed.  Constitutional:      General: She is not in acute distress.    Appearance: Normal appearance.  HENT:     Head: Normocephalic and atraumatic.     Nose: Nose normal.  Eyes:     Extraocular  Movements: Extraocular movements intact.     Conjunctiva/sclera: Conjunctivae normal.  Pulmonary:     Effort: Pulmonary effort is normal.  Musculoskeletal:        General: Normal range of motion.     Cervical back: Normal range of motion.  Neurological:     General: No focal deficit present.     Mental Status: She is alert.  Psychiatric:        Mood and Affect: Mood normal.        Behavior: Behavior normal.      Assessment and Plan:        Uses hormone releasing intrauterine device (IUD) for contraception   Reviewed Mirena IUD is approved for contraceptive use up to 8 years. Will continue use. RTO for annual exam  Rosalyn Gess, MD

## 2023-08-19 ENCOUNTER — Ambulatory Visit: Admitting: Obstetrics and Gynecology

## 2024-01-31 ENCOUNTER — Other Ambulatory Visit: Payer: Self-pay | Admitting: Nurse Practitioner

## 2024-01-31 DIAGNOSIS — E041 Nontoxic single thyroid nodule: Secondary | ICD-10-CM

## 2024-01-31 NOTE — Progress Notes (Signed)
 Thyroid  US  placed. She needs to call and schedule at  [facility]  [address] [place], [place]  [phone] Open 8am-430pm  This is for her yearly follow up on her thyroid  nodules

## 2024-01-31 NOTE — Progress Notes (Signed)
 Called patient information given she repeated number back to me will call and set up appointment. She will reach out if any questions.

## 2024-02-14 ENCOUNTER — Ambulatory Visit
Admission: RE | Admit: 2024-02-14 | Discharge: 2024-02-14 | Disposition: A | Source: Ambulatory Visit | Attending: Nurse Practitioner

## 2024-02-14 DIAGNOSIS — E041 Nontoxic single thyroid nodule: Secondary | ICD-10-CM

## 2024-02-15 ENCOUNTER — Ambulatory Visit: Payer: Self-pay | Admitting: Nurse Practitioner

## 2024-04-03 ENCOUNTER — Telehealth: Payer: Self-pay | Admitting: Nurse Practitioner

## 2024-04-03 NOTE — Telephone Encounter (Signed)
-----   Message from [facility] sent at 04/04/2023  7:49 PM EST ----- Regarding: US  thyroid  Needs follow up US  thyroid  for nodules
